# Patient Record
Sex: Female | Born: 1935 | Race: White | Hispanic: No | Smoking: Former smoker
Health system: Southern US, Community
[De-identification: ages and names within clinical notes are randomized; demographics above are authoritative.]

## PROBLEM LIST (undated history)

## (undated) DIAGNOSIS — G309 Alzheimer's disease, unspecified: Secondary | ICD-10-CM

## (undated) DIAGNOSIS — F17200 Nicotine dependence, unspecified, uncomplicated: Secondary | ICD-10-CM

## (undated) DIAGNOSIS — I252 Old myocardial infarction: Secondary | ICD-10-CM

## (undated) DIAGNOSIS — N8111 Cystocele, midline: Secondary | ICD-10-CM

## (undated) DIAGNOSIS — M549 Dorsalgia, unspecified: Secondary | ICD-10-CM

## (undated) DIAGNOSIS — R413 Other amnesia: Secondary | ICD-10-CM

## (undated) DIAGNOSIS — H269 Unspecified cataract: Secondary | ICD-10-CM

## (undated) DIAGNOSIS — N183 Chronic kidney disease, stage 3 (moderate): Secondary | ICD-10-CM

## (undated) DIAGNOSIS — F0280 Dementia in other diseases classified elsewhere without behavioral disturbance: Secondary | ICD-10-CM

## (undated) DIAGNOSIS — R32 Unspecified urinary incontinence: Secondary | ICD-10-CM

## (undated) DIAGNOSIS — E790 Hyperuricemia without signs of inflammatory arthritis and tophaceous disease: Secondary | ICD-10-CM

## (undated) DIAGNOSIS — G4700 Insomnia, unspecified: Secondary | ICD-10-CM

## (undated) DIAGNOSIS — I2589 Other forms of chronic ischemic heart disease: Secondary | ICD-10-CM

## (undated) DIAGNOSIS — E538 Deficiency of other specified B group vitamins: Secondary | ICD-10-CM

## (undated) DIAGNOSIS — M810 Age-related osteoporosis without current pathological fracture: Secondary | ICD-10-CM

## (undated) DIAGNOSIS — E786 Lipoprotein deficiency: Secondary | ICD-10-CM

## (undated) DIAGNOSIS — I10 Essential (primary) hypertension: Secondary | ICD-10-CM

## (undated) DIAGNOSIS — R269 Unspecified abnormalities of gait and mobility: Secondary | ICD-10-CM

## (undated) DIAGNOSIS — E782 Mixed hyperlipidemia: Secondary | ICD-10-CM

## (undated) DIAGNOSIS — F068 Other specified mental disorders due to known physiological condition: Secondary | ICD-10-CM

## (undated) DIAGNOSIS — I2510 Atherosclerotic heart disease of native coronary artery without angina pectoris: Secondary | ICD-10-CM

## (undated) DIAGNOSIS — R159 Full incontinence of feces: Secondary | ICD-10-CM

## (undated) DIAGNOSIS — Z8673 Personal history of transient ischemic attack (TIA), and cerebral infarction without residual deficits: Secondary | ICD-10-CM

## (undated) HISTORY — DX: Cystocele, midline: N81.11

## (undated) HISTORY — DX: Lipoprotein deficiency: E78.6

## (undated) HISTORY — DX: Hyperuricemia without signs of inflammatory arthritis and tophaceous disease: E79.0

## (undated) HISTORY — DX: Atherosclerotic heart disease of native coronary artery without angina pectoris: I25.10

## (undated) HISTORY — DX: Insomnia, unspecified: G47.00

## (undated) HISTORY — DX: Deficiency of other specified B group vitamins: E53.8

## (undated) HISTORY — DX: Other forms of chronic ischemic heart disease: I25.89

## (undated) HISTORY — DX: Nicotine dependence, unspecified, uncomplicated: F17.200

## (undated) HISTORY — PX: CATARACT EXTRACTION: SUR2

## (undated) HISTORY — DX: Dorsalgia, unspecified: M54.9

## (undated) HISTORY — DX: Full incontinence of feces: R15.9

## (undated) HISTORY — DX: Unspecified urinary incontinence: R32

## (undated) HISTORY — DX: Old myocardial infarction: I25.2

## (undated) HISTORY — DX: Other amnesia: R41.3

## (undated) HISTORY — DX: Essential (primary) hypertension: I10

## (undated) HISTORY — DX: Age-related osteoporosis without current pathological fracture: M81.0

## (undated) HISTORY — DX: Other specified mental disorders due to known physiological condition: F06.8

## (undated) HISTORY — DX: Mixed hyperlipidemia: E78.2

## (undated) HISTORY — DX: Personal history of transient ischemic attack (TIA), and cerebral infarction without residual deficits: Z86.73

## (undated) HISTORY — DX: Unspecified cataract: H26.9

## (undated) HISTORY — DX: Chronic kidney disease, stage 3 (moderate): N18.3

## (undated) HISTORY — DX: Unspecified abnormalities of gait and mobility: R26.9

## (undated) HISTORY — PX: ABDOMINAL HYSTERECTOMY: SHX81

---

## 2003-06-11 DIAGNOSIS — Z8673 Personal history of transient ischemic attack (TIA), and cerebral infarction without residual deficits: Secondary | ICD-10-CM

## 2003-06-11 HISTORY — DX: Personal history of transient ischemic attack (TIA), and cerebral infarction without residual deficits: Z86.73

## 2007-08-09 DIAGNOSIS — F068 Other specified mental disorders due to known physiological condition: Secondary | ICD-10-CM

## 2007-08-09 HISTORY — DX: Other specified mental disorders due to known physiological condition: F06.8

## 2007-09-01 DIAGNOSIS — F17200 Nicotine dependence, unspecified, uncomplicated: Secondary | ICD-10-CM

## 2007-09-01 HISTORY — DX: Nicotine dependence, unspecified, uncomplicated: F17.200

## 2007-12-01 DIAGNOSIS — N183 Chronic kidney disease, stage 3 unspecified: Secondary | ICD-10-CM

## 2007-12-01 HISTORY — DX: Chronic kidney disease, stage 3 unspecified: N18.30

## 2010-12-19 ENCOUNTER — Ambulatory Visit: Payer: Self-pay | Admitting: Ophthalmology

## 2010-12-31 ENCOUNTER — Ambulatory Visit: Payer: Self-pay | Admitting: Ophthalmology

## 2011-02-05 ENCOUNTER — Ambulatory Visit: Payer: Self-pay | Admitting: Ophthalmology

## 2011-06-21 ENCOUNTER — Ambulatory Visit: Payer: Self-pay | Admitting: Family Medicine

## 2012-07-03 DIAGNOSIS — M810 Age-related osteoporosis without current pathological fracture: Secondary | ICD-10-CM

## 2012-07-03 HISTORY — DX: Age-related osteoporosis without current pathological fracture: M81.0

## 2012-11-04 ENCOUNTER — Ambulatory Visit: Payer: Medicare Other | Admitting: Neurology

## 2012-11-19 ENCOUNTER — Encounter: Payer: Self-pay | Admitting: Neurology

## 2012-11-19 ENCOUNTER — Ambulatory Visit (INDEPENDENT_AMBULATORY_CARE_PROVIDER_SITE_OTHER): Payer: Medicare Other | Admitting: Neurology

## 2012-11-19 VITALS — BP 112/62 | HR 88 | Wt 138.0 lb

## 2012-11-19 DIAGNOSIS — R413 Other amnesia: Secondary | ICD-10-CM

## 2012-11-19 DIAGNOSIS — D518 Other vitamin B12 deficiency anemias: Secondary | ICD-10-CM

## 2012-11-19 DIAGNOSIS — R6889 Other general symptoms and signs: Secondary | ICD-10-CM

## 2012-11-19 DIAGNOSIS — R269 Unspecified abnormalities of gait and mobility: Secondary | ICD-10-CM

## 2012-11-19 HISTORY — DX: Unspecified abnormalities of gait and mobility: R26.9

## 2012-11-19 HISTORY — DX: Other amnesia: R41.3

## 2012-11-19 NOTE — Progress Notes (Signed)
Reason for visit: Memory disturbance  Kathleen Graham is a 79 y.o. female  History of present illness:  Kathleen Graham is a 79 year old white female with a history of a progressive memory disturbance that has been present for about 5 years. The patient has progressed relatively slowly, and she currently is living with her daughter-in-law. The patient in the past has had problems with hallucinations, but this issue has not been as significant lately. The patient is able to perform some activities of daily living on her own such as bathing, dressing, and feeding himself. The patient is unable to handle her financial affairs, and she is no longer able to cook. The patient does not operate a motor vehicle. The patient has a mild gait disorder, and she requires some assistance with ambulation. The patient is on Aricept and Namenda, and the family indicates that a recent head scan has not been done. The patient is sent to this office for an evaluation. The family has contacted an attorney concerning legal guardianship. This issue is one of the reasons for the referral to our office.  Past Medical History  Diagnosis Date  . Backache, unspecified   . Full incontinence of feces   . Mixed hyperlipidemia   . History of acute myocardial infarction   . Cystocele, midline   . Coronary atherosclerosis of unspecified type of vessel, native or graft   . Osteoporosis, unspecified 07/03/12    Hip and foremar by DEXA  . 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 Hyperuricemia     Due to HCTZ  . Unspecified urinary incontinence   . Lipoprotein deficiencies   . Unspecified cataract   . History of TIA (transient ischemic attack) 2005  . Tobacco use disorder 09/01/2007  . Other B-complex deficiencies   . Other persistent mental disorders due to conditions classified elsewhere 08-2007  . Essential hypertension, benign   . Insomnia, unspecified   . Chronic kidney disease, stage III (moderate)  12/01/2007  . Memory deficits 11/19/2012  . Abnormality of gait 11/19/2012    Past Surgical History  Procedure Laterality Date  . Cataract extraction Bilateral     Family History  Problem Relation Age of Onset  . Heart attack Son     Social history:  reports that she quit smoking about 17 months ago. She does not have any smokeless tobacco history on file. She reports that she does not drink alcohol or use illicit drugs.  Medications:  No current outpatient prescriptions on file prior to visit.   No current facility-administered medications on file prior to visit.    Allergies:  Allergies  Allergen Reactions  . Hydrochlorothiazide     Hyperurlcemia  . Tramadol     Leg weakness    ROS:  Out of a complete 14 system review of symptoms, the patient complains only of the following symptoms, and all other reviewed systems are negative.  Shortness of breath, cough, wheezing, snoring Feeling cold Memory loss, confusion Slurred speech, dizziness Depression, anxiety, decreased energy, change in appetite Sleepiness  Blood pressure 112/62, pulse 88, weight 138 lb (62.596 kg).  Physical Exam  General: The patient is alert and cooperative at the time of the examination.  Head: Pupils are equal, round, and reactive to light. Discs are flat bilaterally.  Neck: The neck is supple, no carotid bruits are noted.  Respiratory: The respiratory examination is clear.  Cardiovascular: The cardiovascular examination reveals a regular rate and rhythm, no obvious murmurs or rubs  are noted.  Skin: Extremities are with 1+ edema in the ankles bilaterally.  Neurologic Exam  Mental status: Mini-Mental status examination done today shows a total score of 12/30. The patient is able to name 6 animals in 60 seconds.  Cranial nerves: Facial symmetry is present. There is good sensation of the face to pinprick and soft touch bilaterally. The strength of the facial muscles and the muscles to head  turning and shoulder shrug are normal bilaterally. Speech is well enunciated, no aphasia or dysarthria is noted. Extraocular movements are full. Visual fields are full.  Motor: The motor testing reveals 5 over 5 strength of all 4 extremities. Good symmetric motor tone is noted throughout.  Sensory: Sensory testing is intact to pinprick, soft touch, vibration sensation, and position sense on all 4 extremities, with the exception that position sense is decreased on the right greater than left foot. No evidence of extinction is noted.  Coordination: Cerebellar testing reveals good finger-nose-finger bilaterally, but the patient has significant apraxia with the use of the lower extremities, and heel-to-shin could not be performed..  Gait and station: Gait is slightly wide-based, unsteady. Tandem gait was not attempted. Romberg is negative. No drift is seen.  Reflexes: Deep tendon reflexes are symmetric, but are depressed bilaterally. Toes are downgoing bilaterally.   Assessment/Plan:  One. Progressive memory disturbance  2. Gait disturbance  The patient will be set up for some blood work today, and a CT scan of the brain. The patient is to remain on Aricept and Namenda. The patient likely has a moderate range of dementia, and she likely is not competent to make legal, financial, and medical decisions for herself. The patient will likely need a legal guardian for this purpose. The patient will followup through this office if needed.  Marlan Palau MD 11/19/2012 4:34 PM  [facility] [address] [place], [place] [address]  Phone [phone] Fax [phone]

## 2012-11-20 ENCOUNTER — Telehealth: Payer: Self-pay | Admitting: *Deleted

## 2012-11-20 LAB — TSH: TSH: 1.42 u[IU]/mL (ref 0.450–4.500)

## 2012-11-20 NOTE — Telephone Encounter (Signed)
LMVM home # re: lab results unremarkable.  She is to call back if questions.

## 2012-11-20 NOTE — Telephone Encounter (Signed)
Message copied by Hermenia Fiscal on Fri Nov 20, 2012  9:14 AM ------      Message from: Stephanie Acre      Created: Fri Nov 20, 2012  7:51 AM       Please call the patient. The blood work results are unremarkable. Thank you.            ----- Message -----         From: [facility] Lab Results In Interface         Sent: 11/20/2012   5:50 AM           To: York Spaniel, MD                   ------

## 2012-11-24 ENCOUNTER — Ambulatory Visit
Admission: RE | Admit: 2012-11-24 | Discharge: 2012-11-24 | Disposition: A | Discharge status: Home / Self Care | Payer: Medicare Other | Source: Ambulatory Visit | Attending: Neurology | Admitting: Neurology

## 2012-11-24 DIAGNOSIS — R413 Other amnesia: Secondary | ICD-10-CM

## 2012-11-25 ENCOUNTER — Telehealth: Payer: Self-pay | Admitting: Neurology

## 2012-11-25 NOTE — Telephone Encounter (Signed)
I called the patient, and I talked with the caretaker. The CT scan of the head shows some cortical atrophy and some small vessel disease, both progressed from 2007. The patient is on low-dose aspirin. The blood work done was unremarkable. The patient is already on medications for memory, nothing to add at this point.

## 2013-01-13 ENCOUNTER — Other Ambulatory Visit: Payer: Self-pay

## 2013-04-15 ENCOUNTER — Other Ambulatory Visit: Payer: Self-pay

## 2013-07-07 ENCOUNTER — Encounter (HOSPITAL_COMMUNITY): Payer: Self-pay | Admitting: Emergency Medicine

## 2013-07-07 ENCOUNTER — Emergency Department (HOSPITAL_COMMUNITY): Payer: Medicare Other

## 2013-07-07 ENCOUNTER — Inpatient Hospital Stay (HOSPITAL_COMMUNITY): Payer: Medicare Other

## 2013-07-07 ENCOUNTER — Observation Stay (HOSPITAL_COMMUNITY)
Admission: EM | Admit: 2013-07-07 | Discharge: 2013-07-10 | Disposition: A | Discharge status: Home / Self Care | Payer: Medicare Other | Attending: Internal Medicine | Admitting: Internal Medicine

## 2013-07-07 DIAGNOSIS — Z792 Long term (current) use of antibiotics: Secondary | ICD-10-CM | POA: Insufficient documentation

## 2013-07-07 DIAGNOSIS — J4489 Other specified chronic obstructive pulmonary disease: Secondary | ICD-10-CM | POA: Insufficient documentation

## 2013-07-07 DIAGNOSIS — Z7982 Long term (current) use of aspirin: Secondary | ICD-10-CM | POA: Insufficient documentation

## 2013-07-07 DIAGNOSIS — I5020 Unspecified systolic (congestive) heart failure: Secondary | ICD-10-CM | POA: Insufficient documentation

## 2013-07-07 DIAGNOSIS — R269 Unspecified abnormalities of gait and mobility: Secondary | ICD-10-CM | POA: Insufficient documentation

## 2013-07-07 DIAGNOSIS — I951 Orthostatic hypotension: Principal | ICD-10-CM

## 2013-07-07 DIAGNOSIS — M549 Dorsalgia, unspecified: Secondary | ICD-10-CM | POA: Insufficient documentation

## 2013-07-07 DIAGNOSIS — R9431 Abnormal electrocardiogram [ECG] [EKG]: Secondary | ICD-10-CM | POA: Insufficient documentation

## 2013-07-07 DIAGNOSIS — N183 Chronic kidney disease, stage 3 unspecified: Secondary | ICD-10-CM | POA: Insufficient documentation

## 2013-07-07 DIAGNOSIS — I4949 Other premature depolarization: Secondary | ICD-10-CM | POA: Insufficient documentation

## 2013-07-07 DIAGNOSIS — F0390 Unspecified dementia without behavioral disturbance: Secondary | ICD-10-CM

## 2013-07-07 DIAGNOSIS — I129 Hypertensive chronic kidney disease with stage 1 through stage 4 chronic kidney disease, or unspecified chronic kidney disease: Secondary | ICD-10-CM | POA: Insufficient documentation

## 2013-07-07 DIAGNOSIS — I509 Heart failure, unspecified: Secondary | ICD-10-CM | POA: Insufficient documentation

## 2013-07-07 DIAGNOSIS — I2510 Atherosclerotic heart disease of native coronary artery without angina pectoris: Secondary | ICD-10-CM | POA: Insufficient documentation

## 2013-07-07 DIAGNOSIS — G8929 Other chronic pain: Secondary | ICD-10-CM | POA: Insufficient documentation

## 2013-07-07 DIAGNOSIS — Z8673 Personal history of transient ischemic attack (TIA), and cerebral infarction without residual deficits: Secondary | ICD-10-CM | POA: Insufficient documentation

## 2013-07-07 DIAGNOSIS — J449 Chronic obstructive pulmonary disease, unspecified: Secondary | ICD-10-CM | POA: Diagnosis present

## 2013-07-07 DIAGNOSIS — E785 Hyperlipidemia, unspecified: Secondary | ICD-10-CM | POA: Diagnosis present

## 2013-07-07 DIAGNOSIS — R413 Other amnesia: Secondary | ICD-10-CM | POA: Insufficient documentation

## 2013-07-07 DIAGNOSIS — M810 Age-related osteoporosis without current pathological fracture: Secondary | ICD-10-CM | POA: Insufficient documentation

## 2013-07-07 DIAGNOSIS — R791 Abnormal coagulation profile: Secondary | ICD-10-CM | POA: Insufficient documentation

## 2013-07-07 DIAGNOSIS — R55 Syncope and collapse: Secondary | ICD-10-CM

## 2013-07-07 DIAGNOSIS — I672 Cerebral atherosclerosis: Secondary | ICD-10-CM | POA: Insufficient documentation

## 2013-07-07 DIAGNOSIS — F0150 Vascular dementia without behavioral disturbance: Secondary | ICD-10-CM | POA: Insufficient documentation

## 2013-07-07 DIAGNOSIS — I447 Left bundle-branch block, unspecified: Secondary | ICD-10-CM

## 2013-07-07 DIAGNOSIS — I252 Old myocardial infarction: Secondary | ICD-10-CM

## 2013-07-07 DIAGNOSIS — Z87891 Personal history of nicotine dependence: Secondary | ICD-10-CM | POA: Insufficient documentation

## 2013-07-07 DIAGNOSIS — E782 Mixed hyperlipidemia: Secondary | ICD-10-CM | POA: Insufficient documentation

## 2013-07-07 LAB — COMPREHENSIVE METABOLIC PANEL
ALBUMIN: 3.2 g/dL — AB (ref 3.5–5.2)
ALT: 15 U/L (ref 0–35)
AST: 22 U/L (ref 0–37)
Alkaline Phosphatase: 137 U/L — ABNORMAL HIGH (ref 39–117)
BUN: 11 mg/dL (ref 6–23)
CO2: 26 mEq/L (ref 19–32)
CREATININE: 1.45 mg/dL — AB (ref 0.50–1.10)
Calcium: 9 mg/dL (ref 8.4–10.5)
Chloride: 101 mEq/L (ref 96–112)
GFR calc non Af Amer: 33 mL/min — ABNORMAL LOW (ref >90)
GFR, EST AFRICAN AMERICAN: 38 mL/min — AB (ref >90)
GLUCOSE: 104 mg/dL — AB (ref 70–99)
Potassium: 4.3 mEq/L (ref 3.7–5.3)
Sodium: 139 mEq/L (ref 137–147)
TOTAL PROTEIN: 7.4 g/dL (ref 6.0–8.3)
Total Bilirubin: 0.4 mg/dL (ref 0.3–1.2)

## 2013-07-07 LAB — POCT I-STAT TROPONIN I: Troponin i, poc: 0.01 ng/mL (ref 0.00–0.08)

## 2013-07-07 LAB — URINALYSIS, ROUTINE W REFLEX MICROSCOPIC
Bilirubin Urine: NEGATIVE
Glucose, UA: NEGATIVE mg/dL
Hgb urine dipstick: NEGATIVE
Ketones, ur: NEGATIVE mg/dL
Leukocytes, UA: NEGATIVE
NITRITE: NEGATIVE
PROTEIN: NEGATIVE mg/dL
SPECIFIC GRAVITY, URINE: 1.016 (ref 1.005–1.030)
Urobilinogen, UA: 1 mg/dL (ref 0.0–1.0)
pH: 7 (ref 5.0–8.0)

## 2013-07-07 LAB — CBC WITH DIFFERENTIAL/PLATELET
BASOS PCT: 0 % (ref 0–1)
Basophils Absolute: 0 10*3/uL (ref 0.0–0.1)
EOS ABS: 0.4 10*3/uL (ref 0.0–0.7)
Eosinophils Relative: 3 % (ref 0–5)
HEMATOCRIT: 36.5 % (ref 36.0–46.0)
HEMOGLOBIN: 12.4 g/dL (ref 12.0–15.0)
LYMPHS ABS: 2.5 10*3/uL (ref 0.7–4.0)
Lymphocytes Relative: 21 % (ref 12–46)
MCH: 30.8 pg (ref 26.0–34.0)
MCHC: 34 g/dL (ref 30.0–36.0)
MCV: 90.6 fL (ref 78.0–100.0)
MONO ABS: 0.8 10*3/uL (ref 0.1–1.0)
MONOS PCT: 7 % (ref 3–12)
Neutro Abs: 8.1 10*3/uL — ABNORMAL HIGH (ref 1.7–7.7)
Neutrophils Relative %: 69 % (ref 43–77)
Platelets: 153 10*3/uL (ref 150–400)
RBC: 4.03 MIL/uL (ref 3.87–5.11)
RDW: 13.9 % (ref 11.5–15.5)
WBC: 11.8 10*3/uL — ABNORMAL HIGH (ref 4.0–10.5)

## 2013-07-07 LAB — D-DIMER, QUANTITATIVE: D-Dimer, Quant: 4.44 ug/mL-FEU — ABNORMAL HIGH (ref 0.00–0.48)

## 2013-07-07 LAB — LACTIC ACID, PLASMA: Lactic Acid, Venous: 1.9 mmol/L (ref 0.5–2.2)

## 2013-07-07 LAB — TROPONIN I

## 2013-07-07 MED ORDER — SODIUM CHLORIDE 0.9 % IV SOLN
INTRAVENOUS | Status: DC
  Administered 2013-07-08: via INTRAVENOUS

## 2013-07-07 MED ORDER — ONDANSETRON HCL 4 MG/2ML IJ SOLN: 4.0000 mg | Freq: Four times a day (QID) | INTRAMUSCULAR | Status: DC | PRN

## 2013-07-07 MED ORDER — ONDANSETRON HCL 4 MG PO TABS: 4.0000 mg | ORAL_TABLET | Freq: Four times a day (QID) | ORAL | Status: DC | PRN

## 2013-07-07 MED ORDER — TIOTROPIUM BROMIDE MONOHYDRATE 18 MCG IN CAPS
18.0000 ug | ORAL_CAPSULE | Freq: Every day | RESPIRATORY_TRACT | Status: DC
  Administered 2013-07-08 – 2013-07-10 (×2): 18 ug via RESPIRATORY_TRACT
  Filled 2013-07-07: qty 5

## 2013-07-07 MED ORDER — ACETAMINOPHEN 650 MG RE SUPP: 650.0000 mg | Freq: Four times a day (QID) | RECTAL | Status: DC | PRN

## 2013-07-07 MED ORDER — SODIUM CHLORIDE 0.9 % IV BOLUS (SEPSIS)
500.0000 mL | Freq: Once | INTRAVENOUS | Status: AC
  Administered 2013-07-07: 500 mL via INTRAVENOUS

## 2013-07-07 MED ORDER — ACETAMINOPHEN 325 MG PO TABS
650.0000 mg | ORAL_TABLET | Freq: Four times a day (QID) | ORAL | Status: DC | PRN
  Administered 2013-07-10: 650 mg via ORAL
  Filled 2013-07-07: qty 2

## 2013-07-07 MED ORDER — ASPIRIN 81 MG PO CHEW
81.0000 mg | CHEWABLE_TABLET | Freq: Once | ORAL | Status: AC
  Administered 2013-07-08: 81 mg via ORAL
  Filled 2013-07-07: qty 1

## 2013-07-07 MED ORDER — ALBUTEROL SULFATE (2.5 MG/3ML) 0.083% IN NEBU: 2.5000 mg | INHALATION_SOLUTION | RESPIRATORY_TRACT | Status: DC | PRN

## 2013-07-07 MED ORDER — HEPARIN SODIUM (PORCINE) 5000 UNIT/ML IJ SOLN
5000.0000 [IU] | Freq: Three times a day (TID) | INTRAMUSCULAR | Status: DC
  Administered 2013-07-08 – 2013-07-10 (×6): 5000 [IU] via SUBCUTANEOUS
  Filled 2013-07-07 (×10): qty 1

## 2013-07-07 MED ORDER — SODIUM CHLORIDE 0.9 % IJ SOLN
3.0000 mL | Freq: Two times a day (BID) | INTRAMUSCULAR | Status: DC
  Administered 2013-07-08: 3 mL via INTRAVENOUS

## 2013-07-07 NOTE — ED Notes (Signed)
Assumed care of the patient at this time. Family at the bedside.

## 2013-07-07 NOTE — ED Notes (Signed)
Per EMS pt from home with c/o syncopal episode while taking shower. Pt's daughter in law states she was giving her a shower, she passed out, turned grey, stopped breathing. They gave her mouth to mouth, she began to vomit. Color began to return afterwards. EKG shows ST elevation in V1-V4. No chest pain. Pt c/o upper back pain above shoulder blades. IV 18G RAC. CBG 125.

## 2013-07-07 NOTE — ED Notes (Signed)
Pt c/o pain to upper back/lower neck. Pt states she had syncopal episode. Denies chest pain. Denies any recent illness.

## 2013-07-07 NOTE — ED Provider Notes (Signed)
CSN: [id number]     Arrival date & time 07/07/13  1333 History   None    Chief Complaint  Graham presents with  . Abnormal ECG  . Back Pain    HPI: Kathleen Graham is a 80 yo F with history of HLD, CAD, chronic UTI, HTN and dementia, who presents for evaluation of syncopal episode. Kathleen Graham was in Kathleen Graham normal state of health until earlier this afternoon when Kathleen Graham had a bowel movement. Kathleen Graham caregiver put Kathleen Graham into Kathleen shower, Kathleen Graham was sitting down and was uncooperative with Kathleen Graham caregiver, which is not typical for Kathleen Graham. Kathleen Graham then had a syncopal episode lasting for one minute. Kathleen Graham apparently stopped breathing and turned blue, Kathleen Graham was given a few breaths of mouth to mouth resuscitation and recovered. Once Kathleen Graham was awake Kathleen Graham had one episode of emesis. EMS was called, on their arrival Kathleen Graham initial blood pressure was 70 systolic but otherwise was at Kathleen Graham baseline. Kathleen Graham also complained of pain to Kathleen Graham mid thoracic spine just prior to syncopal episode. Kathleen Graham denies any pain on my assessment. Kathleen Graham has a history of chronic low back pain but usually does not complain of thoracic pain. Kathleen Graham currently is on prophylactic Macrobid for chronic UTI.    Past Medical History  Diagnosis Date  . Backache, unspecified   . Full incontinence of feces   . Mixed hyperlipidemia   . History of acute myocardial infarction   . Cystocele, midline   . Coronary atherosclerosis of unspecified type of vessel, native or graft   . Osteoporosis, unspecified 07/03/12    Hip and foremar by DEXA  . 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 Hyperuricemia     Due to HCTZ  . Unspecified urinary incontinence   . Lipoprotein deficiencies   . Unspecified cataract   . History of TIA (transient ischemic attack) 2005  . Tobacco use disorder 09/01/2007  . Other B-complex deficiencies   . Other persistent mental disorders due to conditions classified elsewhere 08-2007  . Essential hypertension, benign   . Insomnia, unspecified   . Chronic  kidney disease, stage III (moderate) 12/01/2007  . Memory deficits 11/19/2012  . Abnormality of gait 11/19/2012   Past Surgical History  Procedure Laterality Date  . Cataract extraction Bilateral    Family History  Problem Relation Age of Onset  . Heart attack Son    History  Substance Use Topics  . Smoking status: Former Smoker    Quit date: 06/11/2011  . Smokeless tobacco: Not on file  . Alcohol Use: No   OB History   Grav Para Term Preterm Abortions TAB SAB Ect Mult Living                  Review of Systems  Constitutional: Negative for fever, chills, appetite change and fatigue.  Eyes: Negative for photophobia and visual disturbance.  Respiratory: Negative for cough and shortness of breath.   Cardiovascular: Negative for chest pain and leg swelling.  Gastrointestinal: Positive for nausea and vomiting. Negative for abdominal pain, diarrhea and constipation.  Genitourinary: Negative for dysuria, frequency and decreased urine volume.  Musculoskeletal: Positive for back pain. Negative for arthralgias, gait problem and myalgias.  Skin: Negative for color change and wound.  Neurological: Positive for syncope. Negative for dizziness, light-headedness and headaches.  Psychiatric/Behavioral: Negative for confusion and agitation.  All other systems reviewed and are negative.     Allergies  Hydrochlorothiazide and Tramadol  Home Medications   Current Outpatient Rx  Name  Route  Sig  Dispense  Refill  . albuterol (PROVENTIL) (2.5 MG/3ML) 0.083% nebulizer solution   Nebulization   Take 2.5 mg by nebulization every 6 (six) hours as needed for wheezing.         Marland Kitchen. alendronate (FOSAMAX) 70 MG tablet   Oral   Take 70 mg by mouth daily.         Marland Kitchen. aspirin 81 MG tablet   Oral   Take 81 mg by mouth daily.         Marland Kitchen. atorvastatin (LIPITOR) 40 MG tablet   Oral   Take 40 mg by mouth daily.         . benzonatate (TESSALON) 200 MG capsule   Oral   Take 200 mg by mouth  daily.         . Calcium Carbonate-Vitamin D 600-400 MG-UNIT per chew tablet   Oral   Chew 1 tablet by mouth daily.         . ciprofloxacin (CIPRO) 250 MG tablet   Oral   Take 250 mg by mouth daily.         . citalopram (CELEXA) 20 MG tablet   Oral   Take 20 mg by mouth daily.         . diazepam (VALIUM) 5 MG tablet   Oral   Take 5 mg by mouth every 6 (six) hours as needed for anxiety (Take one tablet before doctor's appt.).         Marland Kitchen. donepezil (ARICEPT) 10 MG tablet   Oral   Take 10 mg by mouth daily.         . Folic Acid-Vit B6-Vit B12 (FABB) 2.2-25-1 MG TABS   Oral   Take 1 tablet by mouth daily.         Marland Kitchen. NAMENDA 10 MG tablet   Oral   Take 10 mg by mouth daily.         Marland Kitchen. SPIRIVA HANDIHALER 18 MCG inhalation capsule   Inhalation   Place 18 mcg into inhaler and inhale as needed.         . vitamin B-12 (CYANOCOBALAMIN) 1000 MCG tablet   Oral   Take 1,000 mcg by mouth daily.          BP 102/47  Pulse 74  Temp(Src) 98.2 F (36.8 C) (Oral)  Resp 18  SpO2 99% Physical Exam  Nursing note and vitals reviewed. Constitutional: No distress.  Thin, elderly female, laying in bed, appears comfortable. No complaints.   HENT:  Head: Normocephalic and atraumatic.  Mouth/Throat: Oropharynx is clear and moist.  Eyes: Conjunctivae and EOM are normal. Pupils are equal, round, and reactive to light.  Neck: Normal range of motion. Neck supple.  Cardiovascular: Normal rate, regular rhythm and intact distal pulses.   Murmur heard.  Systolic murmur is present with a grade of 2/6  Pulmonary/Chest: Effort normal and breath sounds normal. No respiratory distress.  Abdominal: Soft. Bowel sounds are normal. There is no tenderness. There is no rebound and no guarding.  Musculoskeletal: Normal range of motion. Kathleen Graham exhibits no edema and no tenderness.  Neurological: Kathleen Graham is alert. Kathleen Graham has normal strength and normal reflexes. Kathleen Graham is disoriented (alert to person and  place which is baseline for Kathleen Graham). No cranial nerve deficit or sensory deficit. Coordination normal. GCS eye subscore is 4. GCS verbal subscore is 4. GCS motor subscore is 6.  Skin: Skin is warm and dry. No rash noted.  Psychiatric:  Kathleen Graham has a normal mood and affect. Kathleen Graham behavior is normal.    ED Course  Procedures (including critical care time) Labs Review Labs Reviewed  CBC WITH DIFFERENTIAL - Abnormal; Notable for Kathleen following:    WBC 11.8 (*)    Neutro Abs 8.1 (*)    All other components within normal limits  COMPREHENSIVE METABOLIC PANEL - Abnormal; Notable for Kathleen following:    Glucose, Bld 104 (*)    Creatinine, Ser 1.45 (*)    Albumin 3.2 (*)    Alkaline Phosphatase 137 (*)    GFR calc non Af Amer 33 (*)    GFR calc Af Amer 38 (*)    All other components within normal limits  POCT I-STAT TROPONIN I   Imaging Review Dg Chest Portable 1 View  07/07/2013   CLINICAL DATA:  Abnormal EKG.  Back pain  EXAM: PORTABLE CHEST - 1 VIEW  COMPARISON:  12/08/2012  FINDINGS: Chronic lung disease with diffuse interstitial fibrosis. Improved lung volume compared with Kathleen prior study. No focal infiltrate or effusion is identified.  IMPRESSION: Chronic lung disease with interstitial fibrosis. No superimposed acute abnormality.   Electronically Signed   By: Marlan Palau M.D.   On: 07/07/2013 14:04    EKG Interpretation    Date/Time:  Wednesday July 07 2013 14:02:51 EST Ventricular Rate:  57 PR Interval:  162 QRS Duration: 142 QT Interval:  486 QTC Calculation: 473 R Axis:   -131 Text Interpretation:  Suspect arm lead reversal, interpretation assumes no reversal Sinus bradycardia Non-specific intra-ventricular conduction block Lateral infarct , age undetermined Abnormal ECG No significant change since last tracing Confirmed by POLLINA  MD, CHRISTOPHER ([id number]) on 07/07/2013 3:28:58 PM            MDM   80 yo F with history of HLD, CAD, chronic UTI, HTN and dementia, who  presents for evaluation of syncopal episode. Short period of apnea with color change per family. Currently at Kathleen Graham baseline. Kathleen Graham is hypotensive. I suspect Kathleen Graham is volume depleted which lead to Kathleen Graham syncopal episode. WBC elevated to 11.8, normal CXR, doubt PNA. Lytes normal. Does have a history of CKD, Cr is 1.45, unknown baseline but likely close. Troponin negative, no acute ischemic changes on ECG, doubt ACS. Doubt PE as Kathleen Graham has no SOB or chest pain. No evidence of aortic dissection on CXR.  Will add UA and lactic acid to complete evaluation. Bolus 500 cc and reassess BP. Will require admission given syncopal episode, advanced age and hypotension.   Lactic acid 1.9, troponin X 2 negative. No signs of infection as CXR without consolidation, no fever. UA without infection. Lytes normal. BP improved to 130's systolic after 500 cc bolus. Graham was admitted to teaching service. I spoke to Kathleen Graham and Kathleen Graham daughter about work up and need for admission, they were in agreement with plan. Kathleen Graham remained HD stable in Kathleen ED.   Reviewed imaging, labs, ECG and previous medical records, utilized in MDM  Discussed case with Dr. Blinda Leatherwood.   Clinical Impression 1. Syncope 2. Leukocytosis   Margie Billet, MD 07/08/13 [phone]

## 2013-07-07 NOTE — ED Notes (Signed)
Flow manager called in regards to delay with inpatient bed. Should have an inpatient bed within the half hour

## 2013-07-07 NOTE — ED Notes (Signed)
Family at bedside states pt is incontinent. Family and patient agree to in and out cath

## 2013-07-07 NOTE — ED Notes (Signed)
admitting MD's at bedside

## 2013-07-07 NOTE — H&P (Signed)
Date: 07/07/2013               Patient Name:  Kathleen Graham MRN: [id number]  DOB: Jan 27, 1932 Age / Sex: 80 y.o., female   PCP: Leonides Sake, MD         Medical Service: Internal Medicine Teaching Service         Attending Physician: Dr. Axel Filler, MD    First Contact: Dr. Rebecca Eaton, MD Pager: [phone]  Second Contact: Dr. Jerene Pitch, [facility] Pager: [phone]       After Hours (After 5p/  First Contact Pager: [phone]  weekends / holidays): Second Contact Pager: [phone]   Chief Complaint: Syncope  History of Present Illness: Kathleen Graham is a 80 y.o. woman w/ pmhx of TIA, MI 2 years ago,n severe vascular dementia, HTN, HLD who presents with a cc of syncope. The patient was in her normal state of health. At baseline, she is a+o to person. This afternoon she had an episode of fecal incontinence, which is her baseline. However, this episode was loose while she normally has solid stools. According to the family, this was the first episode of diarrhea. Of note, the patient is on chronic ABX therapy (ciprofloxacin) for chronic UTI. The patient was transferred to the shower the clean her up. She was placed on the commode by  patients primary caregiver and she had a BM. She was then transferred to a shelf in the shower. At this time the patient called out in pain and told the daughter in law that she had pain between the shoulder blades. Apparently this pain is similar to her chronic back pains. While the patients daughter in law started to shower the patient, the patient lost consciousness. The daughter-in-law grabbed the patient and gently lowered her onto the floor. The patient was found to have no pulse and was not breathing. The patient was transferred from the shower to the bed. The patient received mouth to mouth breaths from a family member. The patient appears cyanotic and was unresponsive for 2-3 minutes. The patient regained consciousness and was breathing. She vomiting stomach  contents x 2. EMS arrive on the scene and the patient was hypotensive at ~70/40's. No EMS EKG or rhythm strips were available for review from EMS however, EMS apparently reported ST elevation on the rhythm strip. The patient was back to baseline mental status immediately after waking according to the family members. Upon arrival, BP was 102/47 and was subsequently bolused with 500 cc NS. BP increased to 131/48.  Of note, the daughter in law states that a similar event happened a few weeks ago. During that even the patient passed out briefly, but had no loss of pulse   Meds: No current facility-administered medications for this encounter.   Current Outpatient Prescriptions  Medication Sig Dispense Refill  . albuterol (PROVENTIL) (2.5 MG/3ML) 0.083% nebulizer solution Take 2.5 mg by nebulization every 6 (six) hours as needed for wheezing.      Marland Kitchen alendronate (FOSAMAX) 70 MG tablet Take 70 mg by mouth daily.      Marland Kitchen aspirin 81 MG tablet Take 81 mg by mouth daily.      Marland Kitchen atorvastatin (LIPITOR) 40 MG tablet Take 40 mg by mouth daily.      . benzonatate (TESSALON) 200 MG capsule Take 200 mg by mouth daily.      . Calcium Carbonate-Vitamin D 600-400 MG-UNIT per chew tablet Chew 1 tablet by mouth daily.      Marland Kitchen  ciprofloxacin (CIPRO) 250 MG tablet Take 250 mg by mouth daily.      . citalopram (CELEXA) 20 MG tablet Take 20 mg by mouth daily.      . diazepam (VALIUM) 5 MG tablet Take 5 mg by mouth every 6 (six) hours as needed for anxiety (Take one tablet before doctor's appt.).      Marland Kitchen donepezil (ARICEPT) 10 MG tablet Take 10 mg by mouth daily.      . Folic Acid-Vit G5-XMI W80 (FABB) 2.2-25-1 MG TABS Take 1 tablet by mouth daily.      Marland Kitchen NAMENDA 10 MG tablet Take 10 mg by mouth daily.      Marland Kitchen SPIRIVA HANDIHALER 18 MCG inhalation capsule Place 18 mcg into inhaler and inhale as needed.      . vitamin B-12 (CYANOCOBALAMIN) 1000 MCG tablet Take 1,000 mcg by mouth daily.        Allergies: Allergies as of  07/07/2013 - Review Complete 07/07/2013  Allergen Reaction Noted  . Hydrochlorothiazide  11/19/2012  . Tramadol  11/19/2012   Past Medical History  Diagnosis Date  . Backache, unspecified   . Full incontinence of feces   . Mixed hyperlipidemia   . History of acute myocardial infarction   . Cystocele, midline   . Coronary atherosclerosis of unspecified type of vessel, native or graft   . Osteoporosis, unspecified 07/03/12    Hip and foremar by DEXA  . 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 Hyperuricemia     Due to HCTZ  . Unspecified urinary incontinence   . Lipoprotein deficiencies   . Unspecified cataract   . History of TIA (transient ischemic attack) 2005  . Tobacco use disorder 09/01/2007  . Other B-complex deficiencies   . Other persistent mental disorders due to conditions classified elsewhere 08-2007  . Essential hypertension, benign   . Insomnia, unspecified   . Chronic kidney disease, stage III (moderate) 12/01/2007  . Memory deficits 11/19/2012  . Abnormality of gait 11/19/2012   Past Surgical History  Procedure Laterality Date  . Cataract extraction Bilateral    Family History  Problem Relation Age of Onset  . Heart attack Son    History   Social History  . Marital Status: Widowed    Spouse Name: N/A    Number of Children: 7  . Years of Education: 10   Occupational History  . Not on file.   Social History Main Topics  . Smoking status: Former Smoker    Quit date: 06/11/2011  . Smokeless tobacco: Not on file  . Alcohol Use: No  . Drug Use: No  . Sexual Activity: Not on file   Other Topics Concern  . Not on file   Social History Narrative  . No narrative on file    Review of Systems: Review of systems not obtained due to patient factors.  Physical Exam: Blood pressure 137/65, pulse 89, temperature 98.2 F (36.8 C), temperature source Oral, resp. rate 15, SpO2 99.00%.  Orthostatic Vital Signs: Lying: 169/82 HR  61 Sitting: 155/85 HR 78 Standing: 137/65 HR 89  Blood Pressure: Right: 130/52 Left:140/57  Physical Exam  Constitutional: She appears well-developed and well-nourished. No distress.  HENT:  Head: Normocephalic.  Mouth/Throat: Oropharynx is clear and moist. No oropharyngeal exudate.  Eyes: Pupils are equal, round, and reactive to light.  Cardiovascular: Normal rate, regular rhythm, normal heart sounds and intact distal pulses.  Exam reveals no friction rub.   No murmur heard.  Pulmonary/Chest: Effort normal. No respiratory distress. She has no wheezes. She has rales.  bibasilar crackles  Abdominal: Soft. Bowel sounds are normal. She exhibits no distension. There is no tenderness.  Neurological: She is alert. A cranial nerve deficit is present.  Oriented to person. Able to respond to simple commands. Does not answer complex questions.  PERRL, EOMI, moving both arms and legs spontaneously with no weakness.  Psychiatric:  Patient keeps eyes closed and only responds if directly addressed.   Lab results: Basic Metabolic Panel:  Recent Labs  07/07/13 1353  NA 139  K 4.3  CL 101  CO2 26  GLUCOSE 104*  BUN 11  CREATININE 1.45*  CALCIUM 9.0   Liver Function Tests:  Recent Labs  07/07/13 1353  AST 22  ALT 15  ALKPHOS 137*  BILITOT 0.4  PROT 7.4  ALBUMIN 3.2*   CBC:  Recent Labs  07/07/13 1353  WBC 11.8*  NEUTROABS 8.1*  HGB 12.4  HCT 36.5  MCV 90.6  PLT 153   Urinalysis:  Recent Labs  07/07/13 2038  COLORURINE YELLOW  LABSPEC 1.016  PHURINE 7.0  GLUCOSEU NEGATIVE  HGBUR NEGATIVE  BILIRUBINUR NEGATIVE  KETONESUR NEGATIVE  PROTEINUR NEGATIVE  UROBILINOGEN 1.0  NITRITE NEGATIVE  LEUKOCYTESUR NEGATIVE   Imaging results:  Ct Head Wo Contrast  07/07/2013   CLINICAL DATA:  Syncope.  EXAM: CT HEAD WITHOUT CONTRAST  TECHNIQUE: Contiguous axial images were obtained from the base of the skull through the vertex without intravenous contrast.  COMPARISON:   11/24/2012 and 02/24/2006  FINDINGS: Ventricles, cisterns and other CSF spaces are within normal. There is mild chronic ischemic microvascular disease and mild age related atrophic change. There is no focal mass, mass effect, shift of midline structures or acute hemorrhage. There is no evidence to suggest acute infarction. Remaining bones and soft tissues are within normal.  IMPRESSION: No acute intracranial findings.  Mild chronic ischemic microvascular disease and age related atrophic change.   Electronically Signed   By: Marin Olp M.D.   On: 07/07/2013 20:21   Dg Chest Portable 1 View  07/07/2013   CLINICAL DATA:  Abnormal EKG.  Back pain  EXAM: PORTABLE CHEST - 1 VIEW  COMPARISON:  12/08/2012  FINDINGS: Chronic lung disease with diffuse interstitial fibrosis. Improved lung volume compared with the prior study. No focal infiltrate or effusion is identified.  IMPRESSION: Chronic lung disease with interstitial fibrosis. No superimposed acute abnormality.   Electronically Signed   By: Franchot Gallo M.D.   On: 07/07/2013 14:04    Other results: EKG: LBBB.  Assessment & Plan by Problem: Active Problems:   Syncope  Syncope The patient's syncope is of unknown etiology at this time. The history is suggestive of a vaso-vagal syncope in the setting of orthostatic hypotension. This could certainly be due to medication side effects. However, more concerning causes include aortic dissection, MI, cardiac arrythmia, CVA, TIA, seizure are possible. Aortic dissection is less likely given no significant variance in BP between the arms 140/57 (Right) and 130/52 (Left), which is 83% sensitive for AD. Furthermore, the mediastinum is normal on CXR according to Dr. Annamaria Boots. The patient arrived with BP of 102/47 and was bolused with 500 cc NS. BP increased to 131/48. CT angiogram of the chest was considered but not oredered due to risk of contrast nephropathy (GFR 30's). The risk of contrast induced nephropathy for this  patient is 14% and the risk of dialysis is 0.12%. EKG is concerning for LBBB(no baseline  EKG for comparison), but there was a question about coorrect lead placement, repeating stat EKG. Initial troponin is negative x 2. CT head negative for acute intracranial process and similar to baseline. PE unlikely given [place] 1, [place] 0. Seizure is unlikely given the patients rapid return to baseline mental status. Cardiac dysrythmia is certainly possible given the patients history of prior MI.  - F/U EKG - Admit to tele - Trend Troponins and repeat EKG in am - ASA - Review Med list, and acquire records from PCP in [place]. Current psychoactive medications include valium 5 mg q 6 hr, citalopram 20 mg qd, aricept 10 mg qd, memantadine 10 mg qd, See Side Effect Profiles below  Valium SE:  Cardiovascular: Hypotension, localized phlebitis, vasodilatation Central nervous system: Amnesia, ataxia, confusion, depression, drowsiness, dysarthria, fatigue, headache, slurred speech, vertigo  Citalopram SE: Central nervous system: Somnolence (18%; dose related) Cardiovascular: QT prolongation (2%), hypotension (?1%), orthostatic hypotension (?1%), tachycardia (?1%), bradycardia (1%) Central nervous system: Fatigue (5%; dose related), anxiety (4%), agitation (3%), fever (2%), yawning (2%; dose related), amnesia (?1%), apathy (?1%), concentration impaired (?1%), confusion (?1%), depression (?1%), migraine (?1%), suicide attempt (?1%)  Memantadaine SE: Cardiovascular: Hypertension (4%), hypotension (extended release: 2%) Central nervous system: Dizziness (5% to 7%), confusion (6%), headache (6%), anxiety (extended release: 4%), depression (extended release: 3%), drowsiness (3%), hallucination (3%), pain (3%), aggressive behavior (2%), fatigue (2%)  Diarrhea The patient had apparently only one episode of diarrhea. However, she is on chronic ciprofloxacin for UTI which may predispose her to C. Diff. As she is currently  having orthostatic hypotension, plan to check a C. Dif Ag. - Strict I/Os - If diarrhea persists may consider further workup at that time.    CAD s/b MI Patient had MI 2 years ago according to family. Patient is on statin. Continue home statin and ASA. See above plan for syncope.  Vascular Dementia CT head negative for acute process.  Osteoporosis Holding home alendronate. May restart as outpatient.   D-Dimer Elevation A d-dimer was ordered when the patient was though to have variance between her right and left arms, which was concerning for possible AD. On further review, these BP were taken in the setting of orthostatic BPs. When the BP was repeated in both arms it was not significantly different (<20 mmHg). However, the D-Dimer resulted as 4.4. This elevated D-Dimer is likely due to known CKD, CAD. It is possibly due to other causes which have been addressed previously in this note. Diagnosis of acute aortic dissection by D-dimer: the International Registry of Acute Aortic Dissection Substudy on Biomarkers (IRAD-Bio) experience. AUSuzuki T, [place] A, [place] A, [place]im[place] S, [place], [place], [place] L, Gar[place] A, Sab[place] F, [place], [place], [place] JE, [place] F, [place], [place] E, [place], IRAD-Bio Investigators [employer]. [phone]  Code Status I spoke with the patients daughter in law on the phone. She has been the primary care giver to this patient for the last 4.5 years. The patient lives with her and her husbands, who is the patients son. The care giver stated that when the patient had her heart attack 2 years ago, the entire family met and decided that she should make the major medical decisions. The family discussed the patients wishes in regards to life support. The family decided that the patient would not want to be intubated or receive CPR if she were to stop breathing or her heart were to stop beating.  I have made  the  patient DNR and DNI.   Dispo: Disposition is deferred at this time, awaiting improvement of current medical problems. Anticipated discharge in approximately 1-2 day(s).   The patient does have a current PCP (Leonides Sake, MD) and does not need an [facility] hospital follow-up appointment after discharge.  The patient does have transportation limitations that hinder transportation to clinic appointments.  Signed: Marrion Coy, MD 07/07/2013, 9:39 PM

## 2013-07-08 ENCOUNTER — Encounter (HOSPITAL_COMMUNITY): Payer: Self-pay | Admitting: *Deleted

## 2013-07-08 DIAGNOSIS — R197 Diarrhea, unspecified: Secondary | ICD-10-CM

## 2013-07-08 DIAGNOSIS — M810 Age-related osteoporosis without current pathological fracture: Secondary | ICD-10-CM

## 2013-07-08 DIAGNOSIS — J449 Chronic obstructive pulmonary disease, unspecified: Secondary | ICD-10-CM | POA: Diagnosis present

## 2013-07-08 DIAGNOSIS — I2510 Atherosclerotic heart disease of native coronary artery without angina pectoris: Secondary | ICD-10-CM

## 2013-07-08 DIAGNOSIS — I252 Old myocardial infarction: Secondary | ICD-10-CM

## 2013-07-08 DIAGNOSIS — I517 Cardiomegaly: Secondary | ICD-10-CM

## 2013-07-08 DIAGNOSIS — F0150 Vascular dementia without behavioral disturbance: Secondary | ICD-10-CM

## 2013-07-08 DIAGNOSIS — N189 Chronic kidney disease, unspecified: Secondary | ICD-10-CM

## 2013-07-08 DIAGNOSIS — E785 Hyperlipidemia, unspecified: Secondary | ICD-10-CM | POA: Diagnosis present

## 2013-07-08 DIAGNOSIS — I672 Cerebral atherosclerosis: Secondary | ICD-10-CM

## 2013-07-08 LAB — CBC
HEMATOCRIT: 33.7 % — AB (ref 36.0–46.0)
HEMOGLOBIN: 11.2 g/dL — AB (ref 12.0–15.0)
MCH: 30.1 pg (ref 26.0–34.0)
MCHC: 33.2 g/dL (ref 30.0–36.0)
MCV: 90.6 fL (ref 78.0–100.0)
Platelets: 140 10*3/uL — ABNORMAL LOW (ref 150–400)
RBC: 3.72 MIL/uL — AB (ref 3.87–5.11)
RDW: 14.1 % (ref 11.5–15.5)
WBC: 9.1 10*3/uL (ref 4.0–10.5)

## 2013-07-08 LAB — BASIC METABOLIC PANEL
BUN: 12 mg/dL (ref 6–23)
CO2: 24 mEq/L (ref 19–32)
Calcium: 8.7 mg/dL (ref 8.4–10.5)
Chloride: 105 mEq/L (ref 96–112)
Creatinine, Ser: 1.34 mg/dL — ABNORMAL HIGH (ref 0.50–1.10)
GFR calc Af Amer: 42 mL/min — ABNORMAL LOW (ref >90)
GFR calc non Af Amer: 36 mL/min — ABNORMAL LOW (ref >90)
Glucose, Bld: 80 mg/dL (ref 70–99)
POTASSIUM: 3.8 meq/L (ref 3.7–5.3)
SODIUM: 142 meq/L (ref 137–147)

## 2013-07-08 LAB — TROPONIN I: Troponin I: <0.3 ng/mL (ref <0.30)

## 2013-07-08 LAB — CLOSTRIDIUM DIFFICILE BY PCR: Toxigenic C. Difficile by PCR: NEGATIVE

## 2013-07-08 NOTE — Progress Notes (Signed)
  Echocardiogram 2D Echocardiogram has been performed.  Kathleen Graham 07/08/2013, 11:21 AM

## 2013-07-08 NOTE — Discharge Summary (Signed)
Name: Kathleen Graham MRN: [id number] DOB: Dec 29, 1931 80 y.o. PCP: Ailene Ravel, MD  Date of Admission: 07/07/2013  1:33 PM Date of Discharge: 07/10/2013 Attending Physician: Debe Coder, MD  Discharge Diagnosis: Principal Problem:   Syncope Active Problems:   Memory deficits   Abnormality of gait   COPD (chronic obstructive pulmonary disease)   Other and unspecified hyperlipidemia   History of myocardial infarct at age greater than 60 years  Discharge Medications:   Medication List    STOP taking these medications       diazepam 5 MG tablet  Commonly known as:  VALIUM     donepezil 10 MG tablet  Commonly known as:  ARICEPT     HYDROcodone-acetaminophen 5-325 MG per tablet  Commonly known as:  NORCO/VICODIN     memantine 10 MG tablet  Commonly known as:  NAMENDA     nitrofurantoin 100 MG capsule  Commonly known as:  MACRODANTIN      TAKE these medications       acetaminophen 500 MG tablet  Commonly known as:  TYLENOL  Take 2 tablets (1,000 mg total) by mouth 3 (three) times daily as needed.     albuterol 108 (90 BASE) MCG/ACT inhaler  Commonly known as:  PROVENTIL HFA;VENTOLIN HFA  Inhale 2 puffs into the lungs every 4 (four) hours as needed for wheezing or shortness of breath.     albuterol (2.5 MG/3ML) 0.083% nebulizer solution  Commonly known as:  PROVENTIL  Take 2.5 mg by nebulization every 6 (six) hours as needed for wheezing.     alendronate 70 MG tablet  Commonly known as:  FOSAMAX  Take 70 mg by mouth once a week. Take with a full glass of water on an empty stomach every Tuesday.     aspirin 81 MG tablet  Take 81 mg by mouth daily.     atorvastatin 40 MG tablet  Commonly known as:  LIPITOR  Take 40 mg by mouth daily.     Calcium Carbonate-Vitamin D 600-400 MG-UNIT per chew tablet  Chew 1 tablet by mouth daily.     carvedilol 3.125 MG tablet  Commonly known as:  COREG  Take 3.125 mg by mouth 2 (two) times daily with a meal.      citalopram 20 MG tablet  Commonly known as:  CELEXA  Take 20 mg by mouth daily.     FABB 2.2-25-1 MG Tabs  Generic drug:  Folic Acid-Vit B6-Vit B12  Take 1 tablet by mouth daily.     nitroGLYCERIN 0.4 MG SL tablet  Commonly known as:  NITROSTAT  Place 0.4 mg under the tongue every 5 (five) minutes as needed for chest pain.     tiotropium 18 MCG inhalation capsule  Commonly known as:  SPIRIVA  Place 18 mcg into inhaler and inhale daily.     vitamin B-12 1000 MCG tablet  Commonly known as:  CYANOCOBALAMIN  Take 1,000 mcg by mouth daily.        Disposition and follow-up:   Kathleen Graham was discharged from [facility] in Stable condition.  At the hospital follow up visit please address:  1.  Patient will need to be set up with outpatient cardiac monitor. Cardiology will contact you regarding this appointment  2.  Labs / imaging needed at time of follow-up: Check BMP to assess continued renal function improvement. Check CBC to assess anemia and make sure it is stable.  3.  Pending labs/ test needing  follow-up: None  Follow-up Appointments: Follow-up Information   Follow up with [facility], MD On 07/22/2013. (10:45am)    Specialty:  Family Medicine   Contact information:   Dr. Burnell Blanks [address] [place] [address] [phone]       Follow up with [facility] On 07/15/2013. (10:00AM for event monitor placement)    Contact information:   [address] [place] [phone]      Discharge Instructions: Discharge Orders   Future Appointments Provider Department Dept Phone   07/15/2013 10:00 AM [facility] Office [phone]   Future Orders Complete By Expires   Call MD for:  difficulty breathing, headache or visual disturbances  As directed    Call MD for:  extreme fatigue  As directed    Call MD for:  persistant nausea and vomiting  As directed    Discharge  instructions  As directed    Comments:     Celexa, Aricept, Namenda, and Valium were held on admission and were continued to be held at discharge. You will need to discuss restarting these medications with your primary care provider.   Increase activity slowly  As directed       Consultations: Treatment Team:  Rounding Lbcardiology, MDcardiology  Procedures Performed:  Ct Head Wo Contrast  07/07/2013   CLINICAL DATA:  Syncope.  EXAM: CT HEAD WITHOUT CONTRAST  TECHNIQUE: Contiguous axial images were obtained from the base of the skull through the vertex without intravenous contrast.  COMPARISON:  11/24/2012 and 02/24/2006  FINDINGS: Ventricles, cisterns and other CSF spaces are within normal. There is mild chronic ischemic microvascular disease and mild age related atrophic change. There is no focal mass, mass effect, shift of midline structures or acute hemorrhage. There is no evidence to suggest acute infarction. Remaining bones and soft tissues are within normal.  IMPRESSION: No acute intracranial findings.  Mild chronic ischemic microvascular disease and age related atrophic change.   Electronically Signed   By: Elberta Fortis M.D.   On: 07/07/2013 20:21   Dg Chest Portable 1 View  07/07/2013   CLINICAL DATA:  Abnormal EKG.  Back pain  EXAM: PORTABLE CHEST - 1 VIEW  COMPARISON:  12/08/2012  FINDINGS: Chronic lung disease with diffuse interstitial fibrosis. Improved lung volume compared with the prior study. No focal infiltrate or effusion is identified.  IMPRESSION: Chronic lung disease with interstitial fibrosis. No superimposed acute abnormality.   Electronically Signed   By: Marlan Palau M.D.   On: 07/07/2013 14:04   2D echo 1/29: Study Conclusions  - Left ventricle: The cavity size was normal. Wall thickness was increased in a pattern of mild LVH. Systolic function was normal. The estimated ejection fraction was in the range of 55% to 60%. Wall motion was normal; there were  no regional wall motion abnormalities. There was an increased relative contribution of atrial contraction to ventricular filling. - Aortic valve: Mildly calcified annulus. Trileaflet; normal thickness leaflets. Trivial regurgitation. - Mitral valve: Valve area by pressure half-time: 0.72cm^2. - Pulmonary arteries: PA peak pressure: 31mm Hg (S).   Admission HPI:  Samara Stankowski is a 80 y.o. woman w/ pmhx of TIA, MI 2 years ago,n severe vascular dementia, HTN, HLD who presents with a cc of syncope. The patient was in her normal state of health. At baseline, she is a+o to person. This afternoon she had an episode of fecal incontinence, which is her baseline. However, this episode  was loose while she normally has solid stools. According to the family, this was the first episode of diarrhea. Of note, the patient is on chronic ABX therapy (ciprofloxacin) for chronic UTI. The patient was transferred to the shower the clean her up. She was placed on the commode by patients primary caregiver and she had a BM. She was then transferred to a shelf in the shower. At this time the patient called out in pain and told the daughter in law that she had pain between the shoulder blades. Apparently this pain is similar to her chronic back pains. While the patients daughter in law started to shower the patient, the patient lost consciousness. The daughter-in-law grabbed the patient and gently lowered her onto the floor. The patient was found to have no pulse and was not breathing. The patient was transferred from the shower to the bed. The patient received mouth to mouth breaths from a family member. The patient appears cyanotic and was unresponsive for 2-3 minutes. The patient regained consciousness and was breathing. She vomiting stomach contents x 2. EMS arrive on the scene and the patient was hypotensive at ~70/40's. No EMS EKG or rhythm strips were available for review from EMS however, EMS apparently reported ST  elevation on the rhythm strip. The patient was back to baseline mental status immediately after waking according to the family members. Upon arrival, BP was 102/47 and was subsequently bolused with 500 cc NS. BP increased to 131/48.   Of note, the daughter in law states that a similar event happened a few weeks ago. During that even the patient passed out briefly, but had no loss of pulse  Hospital Course by problem list:  Syncope with known hx of CHF, CAD: The patient's syncope is of unknown etiology at this time. Of note, ACS has been ruled out with three negative troponins. CT head negative. UA negative. CXR showed no acute process. EKG showed LBBB (stable from prior EKG July 2012 from her PCP's office). Blood pressure soft on admission (102/47) that responded well to 500cc NS bolus and they have been stable since (110-140/60s-70s). Given NS at 75cc/hr overnight as well. The history is suggestive of a vaso-vagal syncope in the setting of orthostatic hypotension. Of note, polypharmacy may be playing a role in orthostatic hypotension as patient is on a number of psychoactive medications (valium 5 mg q 6 hr, citalopram 20 mg qd, aricept 10 mg qd, memantadine 10 mg qd). May want to consider discontinuation of some of these medications as outpatient. She was monitored on telemetry during her stay, no events other than some scattered PVCs. Echo was performed on 1/29, which showed normal EF without AS. Given she has hx of prior CAD and MI with known LBBB, we are concerned primarily for a cardiac arrhythmia as the cause of her syncope. Cardiology evaluated patient and recommended arranging for 30 day event monitor as outpatient. This will be arranged by cardiology. Patient remained orthostatic on HD 3, therefore she was given NS at 100cc/hr x 5 hours and monitored overnight. Orthostatic vital signs were checked again in the morning and were normal. PT/OT evaluated patient and did not recommend further PT/OT  services. Cardiology felt that with her h/o LBBB, this raises concer for possibnle high grade AV block and felt that a 30 day cardiac event monitor would be be reasonable. I have spoken to Cardiology and they will contact the patient to set this up after discharge. Hold psychiatric medications including Aricept, Namenda, and Valium  on admission and continued to hold at discharge; also holding Vicodin, as she has not been on this since prior to admission, as these could have contributed to her syncopal episode. She will need to discuss restarting these medications with her PCP- per her caregiver, the family felt that she was on too many medications at home. Will restart SSRI at discharge, as to prevent discontinuation syndrome, and this will need to be tapered off by her PCP.  Diarrhea: Resolved. Patient had diarrhea x 1 episode prior to her syncopal episode. She had two loose bowel movements on HD 1. C diff negative. She is intermittently on ciprofloxacin for recurrent UTI, which may be contributing.  CAD w/ prior MI: Patient had MI in 2013, medical treatment only. Patient is on statin. Continued home statin and ASA on admission and discharge. See above plan for syncope.   H/o CHF, systolic: Patient w/ EF 40% 06/2011. She appeared euvolemic during her hospitalization. Repeat Echo during this admission showed EF 55-60%.   CKD III: Unknown baseline Cr. Cr 1.45 on admission, which improved to 1.34 after IVF (500cc NS bolus and 75cc/hr NS overnight). Per records from PCP, Cr 1.24 on 07/02/2013.  Vascular Dementia: CT head negative for acute process. Patient at baseline mental status per family. Holding Namenda and Aricept.  Osteoporosis: Held home alendronate on admission, which was restarted at discharge.   D-Dimer Elevation: A d-dimer was ordered when the patient was though to have variance between her right and left arms, which was concerning for possible AD. On further review, these BP were taken in the  setting of orthostatic BPs. When the BP was repeated in both arms it was not significantly different (<20 mmHg). However, the D-Dimer resulted as 4.4. This elevated D-Dimer is likely due to known CKD, CAD. Did not pursue further work up for PE given this was considered to be less likely than other etiologies.  Code Status: Dr. Meredith PelJoines spoke with patient's daughter, Olegario MessierKathy. She and her family have decided to keep the patient partial code. They are agreeable to vasoactive and cardiac medications, defibrillation, and CPR. They DO NOT WANT INTUBATION. Patient was made DNI in our system.  Discharge Vitals:   BP 121/54  Pulse 89  Temp(Src) 98.1 F (36.7 C) (Oral)  Resp 18  Ht 5\' 8"  (1.727 m)  Wt 149 lb (67.586 kg)  BMI 22.66 kg/m2  SpO2 95%  Discharge Labs:  No results found for this or any previous visit (from the past 24 hour(s)).  Signed: Genelle GatherGlenn, Ikhlas Albo F, MD Internal Medicine Resident, PGY II [facility] 07/10/2013 4:10 PM    Time Spent on Discharge: 35 minutes Services Ordered on Discharge: none Equipment Ordered on Discharge: none

## 2013-07-08 NOTE — Evaluation (Signed)
Physical Therapy Evaluation Patient Details Name: Kathleen Graham MRN: [id number] DOB: 1932/05/25 Today's Date: 07/08/2013 Time: 4098-1191 PT Time Calculation (min): 24 min  PT Assessment / Plan / Recommendation History of Present Illness  Adm s/p syncopal episode in shower (after having BM). ? cardiac related, although was orthostatic on admission and given IV fluids with good results PMHx includes severe dementia  Clinical Impression  Patient evaluated by Physical Therapy with no further acute PT needs identified.Family present and reports pt has 24 hour care and that she is at baseline for her mobility. PT is signing off. Thank you for this referral.     PT Assessment  Patent does not need any further PT services    Follow Up Recommendations  No PT follow up    Does the patient have the potential to tolerate intense rehabilitation      Barriers to Discharge        Equipment Recommendations  None recommended by PT    Recommendations for Other Services     Frequency      Precautions / Restrictions Precautions Precautions: Fall   Pertinent Vitals/Pain Denied dizziness      Mobility  Bed Mobility Overal bed mobility: Needs Assistance Bed Mobility: Rolling;Sidelying to Sit Rolling: Min guard Sidelying to sit: Min guard General bed mobility comments: pt requires tactile cues to follow through on instructions Transfers Overall transfer level: Needs assistance Equipment used: Rolling walker (2 wheeled) Transfers: Sit to/from Stand Sit to Stand: Min assist General transfer comment: steady assist to stand; guiding assist to fully turn before sitting at end of walk Ambulation/Gait Ambulation/Gait assistance: Min assist Ambulation Distance (Feet): 65 Feet Assistive device: Rolling walker (2 wheeled) Gait Pattern/deviations: Step-through pattern;Decreased stride length;Trunk flexed;Drifts right/left General Gait Details: vc for steering RW (tends to get distracted and  drift to one side) assist for balance x 1 (nearly crossed up her legs)    Exercises     PT Diagnosis:    PT Problem List:   PT Treatment Interventions:       PT Goals(Current goals can be found in the care plan section) Acute Rehab PT Goals PT Goal Formulation: No goals set, d/c therapy  Visit Information  Last PT Received On: 07/08/13 Assistance Needed: +1 History of Present Illness: Adm s/p syncopal episode in shower (after having BM). ? cardiac related, although was orthostatic on admission and given IV fluids with good results PMHx includes severe dementia       Prior Functioning  Home Living Family/patient expects to be discharged to:: Private residence Living Arrangements: Children Available Help at Discharge: Family;Available 24 hours/day Type of Home: House Home Access: Stairs to enter [employer] of Steps: 2 Home Layout: One level Home Equipment: Walker - 2 wheels;Bedside commode Additional Comments: daughter in law present and reports pt typically walks very little "doesn't want to" Prior Function Level of Independence: Needs assistance Gait / Transfers Assistance Needed: with RW with supervision to min assist Communication Communication: No difficulties    Cognition  Cognition Arousal/Alertness: Awake/alert Behavior During Therapy: WFL for tasks assessed/performed Overall Cognitive Status: History of cognitive impairments - at baseline    Extremity/Trunk Assessment Upper Extremity Assessment Upper Extremity Assessment: Overall WFL for tasks assessed Lower Extremity Assessment Lower Extremity Assessment: Overall WFL for tasks assessed Cervical / Trunk Assessment Cervical / Trunk Assessment: Kyphotic   Balance General Comments General comments (skin integrity, edema, etc.): Daughter in law observed and reports pt is at her baseline for mobility  End of  Session PT - End of Session Equipment Utilized During Treatment: Gait belt Activity  Tolerance: Patient tolerated treatment well Patient left: in bed;with call bell/phone within reach;with bed alarm set;with family/visitor present  GP     Claudean Leavelle 07/08/2013, 3:47 PM Pager [phone]

## 2013-07-08 NOTE — Progress Notes (Signed)
Patient refusing Heparin Injections. Patient and family was educated, but she refused all needles at this time. Will continue to monitor.  Valinda HoarLexie Francis Yardley RN

## 2013-07-08 NOTE — Evaluation (Signed)
Clinical/Bedside Swallow Evaluation Patient Details  Name: Kathleen Graham MRN: [id number] Date of Birth: 01-Oct-1931  Today's Date: 07/08/2013 Time: 0900-0913 SLP Time Calculation (min): 13 min  Past Medical History:  Past Medical History  Diagnosis Date  . Backache, unspecified   . Full incontinence of feces   . Mixed hyperlipidemia   . History of acute myocardial infarction   . Cystocele, midline   . Coronary atherosclerosis of unspecified type of vessel, native or graft   . Osteoporosis, unspecified 07/03/12    Hip and foremar by DEXA  . 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 Hyperuricemia     Due to HCTZ  . Unspecified urinary incontinence   . Lipoprotein deficiencies   . Unspecified cataract   . History of TIA (transient ischemic attack) 2005  . Tobacco use disorder 09/01/2007  . Other B-complex deficiencies   . Other persistent mental disorders due to conditions classified elsewhere 08-2007  . Essential hypertension, benign   . Insomnia, unspecified   . Chronic kidney disease, stage III (moderate) 12/01/2007  . Memory deficits 11/19/2012  . Abnormality of gait 11/19/2012   Past Surgical History:  Past Surgical History  Procedure Laterality Date  . Cataract extraction Bilateral    HPI:  80 y.o. woman w/ pmhx of TIA, MI 2 years ago,n severe vascular dementia, HTN, HLD who presents with a cc of syncope. The patient was in her normal state of health when this afternoon she had an episode of loose fecal incontinence. She was transferred to a shelf in the shower and patient called out in pain and told the daughter in law that she had pain between the shoulder blades and pt. lost consciousness and had no pulse and was not breathing. The patient received mouth to mouth breaths from a family member and  was unresponsive for 2-3 minutes. The patient regained consciousness and was breathing. She vomiting stomach contents x 2.  CXR Chronic lung disease with  interstitial fibrosis. No superimposed acute abnormality.  Head CT  no acute abnormality.   Assessment / Plan / Recommendation Clinical Impression  Pt. exhibits an oral-motor pattern typical for pt.'s with dementia including mandible movements at rest (without po's) and prolonged oral prep, mastication and transit with regular texture.  No s/s aspiration during assessment.  Recommend Dys 2 diet texture and thin liquids, straws ok, pills are crushed at home, full supervision assist and check for pocketing.  ST will see short term for diet appropriateness, upgrade and family education.    Aspiration Risk  Moderate    Diet Recommendation Dysphagia 2 (Fine chop);Thin liquid   Liquid Administration via: Straw;Cup Medication Administration: Crushed with puree Supervision: Full supervision/cueing for compensatory strategies;Patient able to self feed Compensations: Slow rate;Small sips/bites;Check for pocketing Postural Changes and/or Swallow Maneuvers: Seated upright 90 degrees;Upright 30-60 min after meal    Other  Recommendations Oral Care Recommendations: Oral care BID   Follow Up Recommendations  None    Frequency and Duration min 1 x/week  2 weeks   Pertinent Vitals/Pain WDL         Swallow Study         Oral/Motor/Sensory Function Overall Oral Motor/Sensory Function: Appears within functional limits for tasks assessed   Ice Chips Ice chips: Not tested   Thin Liquid Thin Liquid: Within functional limits Presentation: Cup;Straw    Nectar Thick Nectar Thick Liquid: Not tested   Honey Thick Honey Thick Liquid: Not tested   Puree Puree: Within functional  limits   Solid   GO    Solid: Impaired Oral Phase Impairments: Impaired mastication Oral Phase Functional Implications:  (delayed prep and transit)       Royce MacadamiaLisa Willis Cerrone Debold M.Ed [employer] Pager [phone]  07/08/2013

## 2013-07-08 NOTE — ED Provider Notes (Signed)
I saw and evaluated the patient, reviewed the resident's note and I agree with the findings, including EKG interpretation, and plan.  Patient seen after syncopal episode. Initial workup did not show any obvious cardiac etiology. No obvious sign of stroke. Because of unexplained syncope, patient will require hospitalization for further evaluation.     Gilda Creasehristopher J. Pollina, MD 07/08/13 [phone]

## 2013-07-08 NOTE — H&P (Signed)
Internal Medicine Attending Admission Note Date: 07/08/2013  Patient name: Kathleen Graham Medical record number: [id number] Date of birth: Sep 08, 1931 Age: 80 y.o. Gender: female  I saw and evaluated the patient. I reviewed the resident's note and I agree with the resident's findings and plan as documented in the resident's note, with the following additional comments.  Chief Complaint(s): Syncope  History - key components related to admission: Patient is an 80 year old woman with history of TIA, status post MI, advanced dementia, hypertension, hyperlipidemia, and other problems as outlined in the medical history, admitted following an episode of syncope that occurred at home.  Patient was being assisted with a bath when she lost consciousness and apparently stopped breathing; a family member gave brief mouth-to-mouth resuscitation and patient responded and started breathing.  She is now alert with no distress, oriented to person only which according to her family is her baseline, and has no complaint.  She denies chest pain, abdominal pain, shortness of breath.  Physical Exam - key components related to admission:  Filed Vitals:   07/07/13 2245 07/07/13 2300 07/07/13 2330 07/08/13 0529  BP: 130/52 140/57 155/58 138/66  Pulse: 67  62 76  Temp:   98.9 F (37.2 C) 98.6 F (37 C)  TempSrc:   Oral Oral  Resp: 16  18 16   Height:   5\' 8"  (1.727 m)   Weight:   149 lb (67.586 kg)   SpO2: 97%  97% 99%   General: Alert, disoriented, no distress Lungs: Clear Heart: Regular; no extra sounds or murmurs Abdomen: Bowel sounds present, soft, nontender Extremities: No edema   Lab results:   Basic Metabolic Panel:  Recent Labs  40/98/1101/28/15 1353 07/08/13 0313  NA 139 142  K 4.3 3.8  CL 101 105  CO2 26 24  GLUCOSE 104* 80  BUN 11 12  CREATININE 1.45* 1.34*  CALCIUM 9.0 8.7    Liver Function Tests:  Recent Labs  07/07/13 1353  AST 22  ALT 15  ALKPHOS 137*  BILITOT 0.4  PROT 7.4   ALBUMIN 3.2*    CBC:  Recent Labs  07/07/13 1353 07/08/13 0313  WBC 11.8* 9.1  HGB 12.4 11.2*  HCT 36.5 33.7*  MCV 90.6 90.6  PLT 153 140*    Recent Labs  07/07/13 1353  NEUTROABS 8.1*  LYMPHSABS 2.5  MONOABS 0.8  EOSABS 0.4  BASOSABS 0.0    Cardiac Enzymes:  Recent Labs  07/07/13 2104  TROPONINI <0.30    D-Dimer:  Recent Labs  07/07/13 2222  DDIMER 4.44*     Urinalysis    Component Value Date/Time   COLORURINE YELLOW 07/07/2013 2038   APPEARANCEUR CLEAR 07/07/2013 2038   LABSPEC 1.016 07/07/2013 2038   PHURINE 7.0 07/07/2013 2038   GLUCOSEU NEGATIVE 07/07/2013 2038   HGBUR NEGATIVE 07/07/2013 2038   BILIRUBINUR NEGATIVE 07/07/2013 2038   KETONESUR NEGATIVE 07/07/2013 2038   PROTEINUR NEGATIVE 07/07/2013 2038   UROBILINOGEN 1.0 07/07/2013 2038   NITRITE NEGATIVE 07/07/2013 2038   LEUKOCYTESUR NEGATIVE 07/07/2013 2038     Imaging results:  Ct Head Wo Contrast  07/07/2013   CLINICAL DATA:  Syncope.  EXAM: CT HEAD WITHOUT CONTRAST  TECHNIQUE: Contiguous axial images were obtained from the base of the skull through the vertex without intravenous contrast.  COMPARISON:  11/24/2012 and 02/24/2006  FINDINGS: Ventricles, cisterns and other CSF spaces are within normal. There is mild chronic ischemic microvascular disease and mild age related atrophic change. There is no focal mass, mass effect,  shift of midline structures or acute hemorrhage. There is no evidence to suggest acute infarction. Remaining bones and soft tissues are within normal.  IMPRESSION: No acute intracranial findings.  Mild chronic ischemic microvascular disease and age related atrophic change.   Electronically Signed   By: Elberta Fortis M.D.   On: 07/07/2013 20:21   Dg Chest Portable 1 View  07/07/2013   CLINICAL DATA:  Abnormal EKG.  Back pain  EXAM: PORTABLE CHEST - 1 VIEW  COMPARISON:  12/08/2012  FINDINGS: Chronic lung disease with diffuse interstitial fibrosis. Improved lung volume compared  with the prior study. No focal infiltrate or effusion is identified.  IMPRESSION: Chronic lung disease with interstitial fibrosis. No superimposed acute abnormality.   Electronically Signed   By: Marlan Palau M.D.   On: 07/07/2013 14:04    Other results: EKG 1/28: Suspect arm lead reversal, interpretation assumes no reversal; sinus bradycardia; non-specific intra-ventricular conduction block; lateral infarct, age undetermined  EKG 1/29: Normal sinus rhythm; left axis deviation; left bundle branch block; no significant change since last tracing  Assessment & Plan by Problem:  1.  Syncope, etiology unclear.  Patient's family report a brief period of respiratory arrest, and that she started breathing again after a very brief attempt at mouth-to-mouth resuscitation by a family member who is not trained in CPR.  This may have been an arrhythmic event.  Patient denies chest pain, and there is no evidence of acute coronary syndrome by enzymes or EKG.  A 2-D echocardiogram today showed normal left ventricular function and normal wall motion.  Patient's d-dimer is elevated, but she has no symptoms to suggest pulmonary embolism.  Plan is monitor on telemetry; obtain outside records from recent hospitalization for syncope at [facility]; it would be helpful to obtain the EMS report; consider cardiology consult for consideration of event monitor.    2.  Diarrhea.  Patient is on chronic ciprofloxacin, and the plan is check C. difficile toxin.  3.  Code status.  I discussed patient by phone with her daughter this morning, who initially requested that patient be kept at full code status until she could discuss the question with her siblings.  I talked with her again after her arrival to the hospital today, and she stated that she had discussed the issue with her siblings and that everyone agrees that patient should not be intubated or put on mechanical ventilation which is in accord with patient's previously  expressed wishes.  They do want other measures done, so code status was changed to partial code (no intubation or mechanical ventilation).  4.  See resident physician's note for other problems and plans.

## 2013-07-08 NOTE — Progress Notes (Addendum)
Subjective: Patient has no complaints today. She has no pain and is very talkative and in good spirits. She was able to follow commands well, though was alert and oriented only to self.   Objective: Vital signs in last 24 hours: Filed Vitals:   07/07/13 2300 07/07/13 2330 07/08/13 0529 07/08/13 1355  BP: 140/57 155/58 138/66 117/68  Pulse:  62 76 80  Temp:  98.9 F (37.2 C) 98.6 F (37 C) 98.7 F (37.1 C)  TempSrc:  Oral Oral Oral  Resp:  18 16 18   Height:  5\' 8"  (1.727 m)    Weight:  149 lb (67.586 kg)    SpO2:  97% 99% 99%   Weight change:   Intake/Output Summary (Last 24 hours) at 07/08/13 1423 Last data filed at 07/08/13 0900  Gross per 24 hour  Intake    360 ml  Output    210 ml  Net    150 ml   Physical Exam General: elderly woman who looks to be fairly well nourished and cared for; she is alert, cooperative, and in no apparent distress HEENT: pupils equal round and reactive to light, vision grossly intact, oropharynx clear and non-erythematous  Neck: supple Lungs: clear to ascultation bilaterally, normal work of respiration, no wheezes, rales, ronchi Heart: regular rate and rhythm, no murmurs, gallops, or rubs Abdomen: soft, non-tender, non-distended, normal bowel sounds Extremities: warm extremities w/ good DP pulses bilaterally; no BLE edema Neurologic: alert & oriented X3, answers some questions inappropriately, but speech is coherent; she is able to follow commands well; cranial nerves II-XII grossly intact, strength grossly intact, sensation intact to light touch  Lab Results: Basic Metabolic Panel:  Recent Labs Lab 07/07/13 1353 07/08/13 0313  NA 139 142  K 4.3 3.8  CL 101 105  CO2 26 24  GLUCOSE 104* 80  BUN 11 12  CREATININE 1.45* 1.34*  CALCIUM 9.0 8.7   Liver Function Tests:  Recent Labs Lab 07/07/13 1353  AST 22  ALT 15  ALKPHOS 137*  BILITOT 0.4  PROT 7.4  ALBUMIN 3.2*   CBC:  Recent Labs Lab 07/07/13 1353 07/08/13 0313    WBC 11.8* 9.1  NEUTROABS 8.1*  --   HGB 12.4 11.2*  HCT 36.5 33.7*  MCV 90.6 90.6  PLT 153 140*   Cardiac Enzymes:  Recent Labs Lab 07/07/13 2104 07/08/13 1115  TROPONINI <0.30 <0.30   D-Dimer:  Recent Labs Lab 07/07/13 2222  DDIMER 4.44*   Urinalysis:  Recent Labs Lab 07/07/13 2038  COLORURINE YELLOW  LABSPEC 1.016  PHURINE 7.0  GLUCOSEU NEGATIVE  HGBUR NEGATIVE  BILIRUBINUR NEGATIVE  KETONESUR NEGATIVE  PROTEINUR NEGATIVE  UROBILINOGEN 1.0  NITRITE NEGATIVE  LEUKOCYTESUR NEGATIVE    Micro Results: No results found for this or any previous visit (from the past 240 hour(s)). Studies/Results: Ct Head Wo Contrast  07/07/2013   CLINICAL DATA:  Syncope.  EXAM: CT HEAD WITHOUT CONTRAST  TECHNIQUE: Contiguous axial images were obtained from the base of the skull through the vertex without intravenous contrast.  COMPARISON:  11/24/2012 and 02/24/2006  FINDINGS: Ventricles, cisterns and other CSF spaces are within normal. There is mild chronic ischemic microvascular disease and mild age related atrophic change. There is no focal mass, mass effect, shift of midline structures or acute hemorrhage. There is no evidence to suggest acute infarction. Remaining bones and soft tissues are within normal.  IMPRESSION: No acute intracranial findings.  Mild chronic ischemic microvascular disease and age related atrophic change.  Electronically Signed   By: Elberta Fortisaniel  Boyle M.D.   On: 07/07/2013 20:21   Dg Chest Portable 1 View  07/07/2013   CLINICAL DATA:  Abnormal EKG.  Back pain  EXAM: PORTABLE CHEST - 1 VIEW  COMPARISON:  12/08/2012  FINDINGS: Chronic lung disease with diffuse interstitial fibrosis. Improved lung volume compared with the prior study. No focal infiltrate or effusion is identified.  IMPRESSION: Chronic lung disease with interstitial fibrosis. No superimposed acute abnormality.   Electronically Signed   By: Marlan Palauharles  Clark M.D.   On: 07/07/2013 14:04   Medications: I have  reviewed the patient's current medications. Scheduled Meds: . heparin  5,000 Units Subcutaneous Q8H  . sodium chloride  3 mL Intravenous Q12H  . tiotropium  18 mcg Inhalation Daily   Continuous Infusions: . sodium chloride 75 mL/hr at 07/08/13 0023   PRN Meds:.acetaminophen, acetaminophen, albuterol, ondansetron (ZOFRAN) IV, ondansetron  Assessment/Plan: Syncope: The patient's syncope is of unknown etiology at this time. Of note, ACS has been ruled out with three negative troponins. CT head negative. CXR showed no acute process. EKG showed LBBB (stable from prior EKG July 2012 from her PCP's office). Blood pressure soft on admission (102/47) that responded well to 500cc NS bolus and they have been stable since. Given NS at 75cc/hr overnight as well. The history is suggestive of a vaso-vagal syncope in the setting of orthostatic hypotension (patient orthostatic by DBP from sitting to standing only). Other possibilities include cardiac arrhythmia (not sure how old her LBBB is), CVA, MI, seizure, PE.  Per chart review, patient has known sCHF, EF 40% w/ area of akinesis over apex (c/w prior MI), there was no AS. Based on the story I heard from her family, I am most suspicious for cardiac etiology. She was monitored on telemetry overnight and there were no abnormalities or arrhythmias seen (only a few scattered PVCs). We will evaluate for cardiac structural abnormalities w/ 2D echo today and go from there. The D dimer that was ordered overnight was elevated, but was ordered using false information. Patient scores very low probability on modified Geneva and Wells scores, so I will not pursue further work up for PE at this time as patient has significant kidney disease and I feel as though the risk of CIN is higher than potential benefit of CTA chest. Polypharmacy may be playing a role as patient is on a number of psychoactive medications (valium 5 mg q 6 hr, citalopram 20 mg qd, aricept 10 mg qd, memantadine  10 mg qd). - telemetry  - ASA  -SLP evaluated patient and approved Dys 2 diet - d/c IVF as patient is tolerating PO now - PT/OT to eval -assess medication list and make changes as needed -obtained records from her PCP (Dr. Nathanial RancherHamrick in [place], [place] [phone])  Diarrhea: Patient has had two loose bowel movements since her admission. Sending C diff Ag since she is on chronic ciprofloxacin for recurrent UTI. -C diff ag pending -Strict I/Os   CAD w/ prior MI: Patient had MI 2 years ago according to family. Patient is on statin. Continue home statin and ASA. See above plan for syncope.   CKD: Unknown baseline Cr. Cr 1.45 on admission. Today Cr 1.34 after IVF (500cc NS bolus and 75cc/hr NS overnight).  Vascular Dementia: CT head negative for acute process. Patient is at baseline mental status per family.  Osteoporosis: Holding home alendronate. May restart as outpatient.   Code Status: Dr. Meredith PelJoines spoke with patient's daughter, Olegario MessierKathy. She  and her family have decided to keep the patient partial code. They are agreeable to vasoactive and cardiac medications, defibrillation, and CPR. They DO NOT WANT INTUBATION. Patient was made DNI in our system.  Dispo: Disposition is deferred at this time, awaiting improvement of current medical problems.  Anticipated discharge in approximately 1-2 day(s).   The patient does have a current PCP (Ailene Ravel, MD) and does not need an [facility] hospital follow-up appointment after discharge.  The patient does not have transportation limitations that hinder transportation to clinic appointments.  .Services Needed at time of discharge: Y = Yes, Blank = No PT:   OT:   RN:   Equipment:   Other:     LOS: 1 day   Windell Hummingbird, MD 07/08/2013, 2:23 PM

## 2013-07-09 ENCOUNTER — Other Ambulatory Visit: Payer: Self-pay | Admitting: Physician Assistant

## 2013-07-09 DIAGNOSIS — I469 Cardiac arrest, cause unspecified: Secondary | ICD-10-CM

## 2013-07-09 DIAGNOSIS — R55 Syncope and collapse: Secondary | ICD-10-CM

## 2013-07-09 MED ORDER — SODIUM CHLORIDE 0.9 % IV SOLN
INTRAVENOUS | Status: AC
  Administered 2013-07-09: 13:00:00 via INTRAVENOUS

## 2013-07-09 NOTE — Consult Note (Signed)
Referring Physician:  Primary Physician: Primary Cardiologist: Reason for Consultation:    HPI:  Kathleen Graham is a 80 y.o. woman with h/o TIA, COPD, ? MI 2 years ago, severe vascular dementia, HTN, HLD who was admitted with syncope/respiratory arrest.   According to the notes and family, patient was in her normal state of health (can ambulate around house with help) when she developed episode of fecal incontinence, which is chronic. The patient was transferred to the shower the clean her up. She was placed on the commode by patients primary caregiver and she had a BM. She was then transferred to a shelf in the shower. At this time the patient called out in pain and told the daughter in law that she had pain between the shoulder blades. Apparently this pain is similar to her chronic back pains. While the patients daughter in law started to shower the patient, the patient lost consciousness. The daughter-in-law grabbed the patient and gently lowered her onto the floor. The patient was found to have no pulse and was not breathing. The patient was transferred from the shower to the bed. The patient received mouth to mouth breaths from a family member. The patient appears cyanotic and was unresponsive for 2-3 minutes. The patient regained consciousness and was breathing. She vomiting stomach contents x 2. EMS arrive on the scene and the patient was hypotensive at ~70/40's.The patient was back to baseline mental status immediately after waking according to the family members. Upon arrival, BP was 102/47 and was subsequently bolused with 500 cc NS. BP increased to 131/48. Of note, the daughter in law states that she had syncopal episode 6 months ago without loss of pulse.  They tell me she was [facility] years ago with CP and there was some controversy over whether or not she had an MI. No cath or stress test done.   While in hospital had echo with normal LV and RV function.  Troponins normal but d-dimer 4.4. Tele with sinus rhythm with occasional multifocal PVCs and couplets. No pause.   ECG: NSR 61 LBBB 144 ms PR 148 ms   Review of Systems: Poor historian. Unable to elicit except for HPI from family.  Past Medical History  Diagnosis Date  . Backache, unspecified   . Full incontinence of feces   . Mixed hyperlipidemia   . History of acute myocardial infarction   . Cystocele, midline   . Coronary atherosclerosis of unspecified type of vessel, native or graft   . Osteoporosis, unspecified 07/03/12    Hip and foremar by DEXA  . 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 Hyperuricemia     Due to HCTZ  . Unspecified urinary incontinence   . Lipoprotein deficiencies   . Unspecified cataract   . History of TIA (transient ischemic attack) 2005  . Tobacco use disorder 09/01/2007  . Other B-complex deficiencies   . Other persistent mental disorders due to conditions classified elsewhere 08-2007  . Essential hypertension, benign   . Insomnia, unspecified   . Chronic kidney disease, stage III (moderate) 12/01/2007  . Memory deficits 11/19/2012  . Abnormality of gait 11/19/2012    Medications Prior to Admission  Medication Sig Dispense Refill  . acetaminophen (TYLENOL) 500 MG tablet Take 1,000 mg by mouth 3 (three) times daily.      Marland Kitchen. albuterol (PROVENTIL HFA;VENTOLIN HFA) 108 (90 BASE) MCG/ACT inhaler Inhale 2 puffs into the lungs every 4 (four) hours as needed  for wheezing or shortness of breath.      Marland Kitchen alendronate (FOSAMAX) 70 MG tablet Take 70 mg by mouth once a week. Take with a full glass of water on an empty stomach every Tuesday.      Marland Kitchen aspirin 81 MG tablet Take 81 mg by mouth daily.      Marland Kitchen atorvastatin (LIPITOR) 40 MG tablet Take 40 mg by mouth daily.      . Calcium Carbonate-Vitamin D 600-400 MG-UNIT per chew tablet Chew 1 tablet by mouth daily.      . carvedilol (COREG) 3.125 MG tablet Take 3.125 mg by mouth 2 (two) times daily with a  meal.      . citalopram (CELEXA) 20 MG tablet Take 20 mg by mouth daily.      . diazepam (VALIUM) 5 MG tablet Take 5 mg by mouth every 6 (six) hours as needed for anxiety.      . donepezil (ARICEPT) 10 MG tablet Take 10 mg by mouth daily.      . Folic Acid-Vit B6-Vit B12 (FABB) 2.2-25-1 MG TABS Take 1 tablet by mouth daily.      Marland Kitchen HYDROcodone-acetaminophen (NORCO/VICODIN) 5-325 MG per tablet Take 0.5 tablets by mouth 2 (two) times daily as needed for moderate pain.      . memantine (NAMENDA) 10 MG tablet Take 10 mg by mouth 2 (two) times daily.      . nitrofurantoin (MACRODANTIN) 100 MG capsule Take 100 mg by mouth daily.      . nitroGLYCERIN (NITROSTAT) 0.4 MG SL tablet Place 0.4 mg under the tongue every 5 (five) minutes as needed for chest pain.      Marland Kitchen tiotropium (SPIRIVA) 18 MCG inhalation capsule Place 18 mcg into inhaler and inhale daily.      . vitamin B-12 (CYANOCOBALAMIN) 1000 MCG tablet Take 1,000 mcg by mouth daily.      Marland Kitchen albuterol (PROVENTIL) (2.5 MG/3ML) 0.083% nebulizer solution Take 2.5 mg by nebulization every 6 (six) hours as needed for wheezing.         . heparin  5,000 Units Subcutaneous Q8H  . sodium chloride  3 mL Intravenous Q12H  . tiotropium  18 mcg Inhalation Daily    Infusions: . sodium chloride 100 mL/hr at 07/09/13 1237    Allergies  Allergen Reactions  . Hydrochlorothiazide     Hyperurlcemia  . Tramadol     Leg weakness    History   Social History  . Marital Status: Widowed    Spouse Name: N/A    Number of Children: 7  . Years of Education: 10   Occupational History  . Not on file.   Social History Main Topics  . Smoking status: Former Smoker    Quit date: 06/11/2011  . Smokeless tobacco: Not on file  . Alcohol Use: No  . Drug Use: No  . Sexual Activity: Not on file   Other Topics Concern  . Not on file   Social History Narrative  . No narrative on file    Family History  Problem Relation Age of Onset  . Heart attack Son      PHYSICAL EXAM: Filed Vitals:   07/09/13 0920  BP: 110/63  Pulse: 102  Temp:   Resp:      Intake/Output Summary (Last 24 hours) at 07/09/13 1316 Last data filed at 07/09/13 0900  Gross per 24 hour  Intake 1996.25 ml  Output      1 ml  Net 1995.25 ml  General:  Elderly chronically ill appearing. Lethargic. Opens eyes to voice but then closes them  HEENT: normal Neck: supple. no JVD. Carotids 2+ bilat  No lymphadenopathy or thryomegaly appreciated. Cor: PMI nondisplaced. Regular rate & rhythm. 2/6 SEM RSB Lungs: clear decreased BS Abdomen: soft, nontender, nondistended. No hepatosplenomegaly. No bruits or masses. Good bowel sounds. Extremities: no cyanosis, clubbing, rash, edema Neuro: somnolent. Opens eyes. Mumbles a bit. Otherwise non-focal   No results found for this or any previous visit (from the past 24 hour(s)). Ct Head Wo Contrast  07/07/2013   CLINICAL DATA:  Syncope.  EXAM: CT HEAD WITHOUT CONTRAST  TECHNIQUE: Contiguous axial images were obtained from the base of the skull through the vertex without intravenous contrast.  COMPARISON:  11/24/2012 and 02/24/2006  FINDINGS: Ventricles, cisterns and other CSF spaces are within normal. There is mild chronic ischemic microvascular disease and mild age related atrophic change. There is no focal mass, mass effect, shift of midline structures or acute hemorrhage. There is no evidence to suggest acute infarction. Remaining bones and soft tissues are within normal.  IMPRESSION: No acute intracranial findings.  Mild chronic ischemic microvascular disease and age related atrophic change.   Electronically Signed   By: Elberta Fortis M.D.   On: 07/07/2013 20:21   Dg Chest Portable 1 View  07/07/2013   CLINICAL DATA:  Abnormal EKG.  Back pain  EXAM: PORTABLE CHEST - 1 VIEW  COMPARISON:  12/08/2012  FINDINGS: Chronic lung disease with diffuse interstitial fibrosis. Improved lung volume compared with the prior study. No focal infiltrate  or effusion is identified.  IMPRESSION: Chronic lung disease with interstitial fibrosis. No superimposed acute abnormality.   Electronically Signed   By: Marlan Palau M.D.   On: 07/07/2013 14:04     ASSESSMENT: 1. Recurrent syncope/cardiorespiratory arrest 2. Dementia 3. H/o MI 4. LBBB 5. PVCs  PLAN/DISCUSSION:  Etiology of syncope/arrest is unclear. With normal troponins I suspect this is not driven by myocardial ischemia. May likely be vagal episode in setting of diarrhea and showering but need to exclude more malignant underlying arrhythmias or pauses. Agree with continuing her on tele while in hospital and can consider an event monitor on discharge. I discussed briefly the possibility of a pacemaker (no indication for it currently) with the family if monitor shows bradycardia/pauses but they were not overly interested in her having any procedures. With elevated d-dimer could consider work-up for PE but no clinical evidence of RV strain or respiratory compromise so I don't feel strongly about this.   At this point I think support care is the best approach. Given her comorbidities may be reasonable to discuss code status with family in the event that this happens again.   We will follow.   Daniel Bensimhon,MD 1:48 PM

## 2013-07-09 NOTE — Progress Notes (Addendum)
Subjective: Patient has no complaints today. She is tired since she states she didn't sleep well. Nurse recheck orthostatics today and patient remains orthostatic. We also check orthostatics during our team exam and patient is orthostatic by SBP (22 systolic blood pressure points).  Objective: Vital signs in last 24 hours: Filed Vitals:   07/09/13 0544 07/09/13 0915 07/09/13 0917 07/09/13 0920  BP: 140/75 151/68 140/77 110/63  Pulse: 102 89 97 102  Temp: 98.9 F (37.2 C) 98.3 F (36.8 C)    TempSrc: Oral Oral    Resp: 18 18    Height:      Weight:      SpO2: 99% 100% 100% 99%  Lying BP 137/54  HR 87 Standing after two minutes: BP 115/59   HR 104  Weight change:   Intake/Output Summary (Last 24 hours) at 07/09/13 1303 Last data filed at 07/09/13 0900  Gross per 24 hour  Intake 1996.25 ml  Output      1 ml  Net 1995.25 ml   Physical Exam General: alert, cooperative, NAD HEENT: NCAT, vision grossly intact, oropharynx clear and non-erythematous  Neck: supple Lungs: clear to ascultation bilaterally, normal work of respiration, no wheezes, rales, ronchi Heart: regular rate and rhythm, no murmurs, gallops, or rubs Abdomen: soft, non-tender, non-distended, normal bowel sounds Extremities: warm extremities w/ good DP pulses bilaterally; no BLE edema Neurologic: alert & oriented X3, answers some questions inappropriately, but speech is coherent; she is able to follow commands well; cranial nerves II-XII grossly intact, strength grossly intact (she is able to move from lying position to standing with minimal assistance, sensation intact to light touch  Lab Results: Basic Metabolic Panel:  Recent Labs Lab 07/07/13 1353 07/08/13 0313  NA 139 142  K 4.3 3.8  CL 101 105  CO2 26 24  GLUCOSE 104* 80  BUN 11 12  CREATININE 1.45* 1.34*  CALCIUM 9.0 8.7   Liver Function Tests:  Recent Labs Lab 07/07/13 1353  AST 22  ALT 15  ALKPHOS 137*  BILITOT 0.4  PROT 7.4  ALBUMIN  3.2*   CBC:  Recent Labs Lab 07/07/13 1353 07/08/13 0313  WBC 11.8* 9.1  NEUTROABS 8.1*  --   HGB 12.4 11.2*  HCT 36.5 33.7*  MCV 90.6 90.6  PLT 153 140*   Cardiac Enzymes:  Recent Labs Lab 07/07/13 2104 07/08/13 1115  TROPONINI <0.30 <0.30   D-Dimer:  Recent Labs Lab 07/07/13 2222  DDIMER 4.44*   Urinalysis:  Recent Labs Lab 07/07/13 2038  COLORURINE YELLOW  LABSPEC 1.016  PHURINE 7.0  GLUCOSEU NEGATIVE  HGBUR NEGATIVE  BILIRUBINUR NEGATIVE  KETONESUR NEGATIVE  PROTEINUR NEGATIVE  UROBILINOGEN 1.0  NITRITE NEGATIVE  LEUKOCYTESUR NEGATIVE    Micro Results: Recent Results (from the past 240 hour(s))  CLOSTRIDIUM DIFFICILE BY PCR     Status: None   Collection Time    07/08/13  1:07 PM      Result Value Range Status   C difficile by pcr NEGATIVE  NEGATIVE Final   Studies/Results: Ct Head Wo Contrast  07/07/2013   CLINICAL DATA:  Syncope.  EXAM: CT HEAD WITHOUT CONTRAST  TECHNIQUE: Contiguous axial images were obtained from the base of the skull through the vertex without intravenous contrast.  COMPARISON:  11/24/2012 and 02/24/2006  FINDINGS: Ventricles, cisterns and other CSF spaces are within normal. There is mild chronic ischemic microvascular disease and mild age related atrophic change. There is no focal mass, mass effect, shift of midline structures  or acute hemorrhage. There is no evidence to suggest acute infarction. Remaining bones and soft tissues are within normal.  IMPRESSION: No acute intracranial findings.  Mild chronic ischemic microvascular disease and age related atrophic change.   Electronically Signed   By: Elberta Fortisaniel  Boyle M.D.   On: 07/07/2013 20:21   Dg Chest Portable 1 View  07/07/2013   CLINICAL DATA:  Abnormal EKG.  Back pain  EXAM: PORTABLE CHEST - 1 VIEW  COMPARISON:  12/08/2012  FINDINGS: Chronic lung disease with diffuse interstitial fibrosis. Improved lung volume compared with the prior study. No focal infiltrate or effusion is  identified.  IMPRESSION: Chronic lung disease with interstitial fibrosis. No superimposed acute abnormality.   Electronically Signed   By: Marlan Palauharles  Clark M.D.   On: 07/07/2013 14:04   Medications: I have reviewed the patient's current medications. Scheduled Meds: . heparin  5,000 Units Subcutaneous Q8H  . sodium chloride  3 mL Intravenous Q12H  . tiotropium  18 mcg Inhalation Daily   Continuous Infusions: . sodium chloride 100 mL/hr at 07/09/13 1237   PRN Meds:.acetaminophen, acetaminophen, albuterol, ondansetron (ZOFRAN) IV, ondansetron  Assessment/Plan: Syncope: Patient has had no more episodes of syncope. No events on telemetry except for some scattered PVCs. Echo showed normal EF without AS. Given she has hx of prior CAD and MI with known LBBB, we are concerned primarily for a cardiac arrhythmia as the cause of her prior syncope. Patient remains orthostatic despite receiving IVF on night of admission, which may also have been the cause of her syncope. She did endorse having some lightheadedness when she stood up from seated position. Will give NS at 100cc/hr x 5 hours today and recheck orthostatics. Some of her medications can also cause orthostatic hypotension, though these have been held since she was admitted.  -cardiology consulted, recommend to r/o arrhythmia with continued telemetry during her hospital stay w/ set up of outpatient cardiac monitor; will speak to cardiology to set this up -IVF: NS at 100cc/hr x 5hrs -recheck orthostatic vital signs in AM prior to d/c - telemetry  - ASA  - PT/OT both recommend no further PT/OT follow up -assess medication list and make changes as needed -of note, daughter Olegario Messier(Kathy) will need a note excusing her from work during patient's hospitalization  Diarrhea: C diff negative. No more diarrhea since yesterday. -Strict I/Os   CAD w/ prior MI: Patient had MI 2 years ago according to family. Patient is on statin. Continue home statin and ASA. See  above plan for syncope.   CKD: Unknown baseline Cr. Cr 1.45 on admission and decreased to 1.34 on 1/30. Did not recheck labs today.   Vascular Dementia: CT head negative for acute process. Patient is at baseline mental status per family.  Osteoporosis: Holding home alendronate. May restart as outpatient.   Code Status: DNI  Dispo: Disposition is deferred at this time, awaiting improvement of current medical problems.   The patient does have a current PCP (Ailene RavelMaura L Hamrick, MD) and [facility] after discharge.  The patient does not have transportation limitations that hinder transportation to clinic appointments.  .Services Needed at time of discharge: Y = Yes, Blank = No PT:   OT:   RN:   Equipment:   Other:     LOS: 2 days   Windell Hummingbirdachel Americo Vallery, MD 07/09/2013, 1:03 PM

## 2013-07-09 NOTE — Evaluation (Signed)
Occupational Therapy Evaluation Patient Details Name: Kathleen Graham MRN: [id number] DOB: 1932/05/09 Today's Date: 07/09/2013 Time: [id number] OT Time Calculation (min): 38 min  OT Assessment / Plan / Recommendation History of present illness Adm s/p syncopal episode in shower (after having BM). ? cardiac related, although was orthostatic on admission and given IV fluids with good results PMHx includes severe dementia   Clinical Impression   Pt. Appears to be at baseline performance for ADLs and is limited by cognition.  Pt. Is able to follow 1 step directions with occasional visual cues needed. Pt. Was able to maintain sitting balance EOB during ADL activity. Per family report in PT eval pt. Appears baseline for mobility.     OT Assessment  Patient does not need any further OT services    Follow Up Recommendations  No OT follow up    Barriers to Discharge      Equipment Recommendations  None recommended by OT    Recommendations for Other Services    Frequency       Precautions / Restrictions Precautions Precautions: Fall   Pertinent Vitals/Pain No c/o    ADL  Eating/Feeding: Simulated;Set up Where Assessed - Eating/Feeding: Edge of bed Grooming: Performed;Wash/dry hands;Wash/dry face;Set up Where Assessed - Grooming: Unsupported sitting Upper Body Bathing: Simulated;Set up Where Assessed - Upper Body Bathing: Unsupported sitting Lower Body Bathing: Simulated;Set up Where Assessed - Lower Body Bathing: Unsupported sitting;Supported standing Upper Body Dressing: Performed;Minimal assistance Where Assessed - Upper Body Dressing: Unsupported sitting Lower Body Dressing: Performed;Minimal assistance Where Assessed - Lower Body Dressing: Unsupported sitting;Supported standing Toilet Transfer: Simulated;Minimal assistance Toilet Transfer Method: Stand pivot ADL Comments:  (Pt. appears to be at baseline )    OT Diagnosis:    OT Problem List:   OT Treatment Interventions:      OT Goals(Current goals can be found in the care plan section) Acute Rehab OT Goals Patient Stated Goal:  (go home)  Visit Information  Last OT Received On: 07/09/13 Assistance Needed: +1 History of Present Illness: Adm s/p syncopal episode in shower (after having BM). ? cardiac related, although was orthostatic on admission and given IV fluids with good results PMHx includes severe dementia       Prior Functioning     Home Living Family/patient expects to be discharged to:: Private residence Living Arrangements: Children Available Help at Discharge: Family;Available 24 hours/day Type of Home: House Home Access: Stairs to enter [employer] CorporationEntrance Stairs-Number of Steps: 2 Home Layout: One level Home Equipment: Walker - 2 wheels;Bedside commode Additional Comments:  (unable to obtain information on bathroom layout ) Prior Function Level of Independence: Needs assistance Gait / Transfers Assistance Needed: with RW with supervision to min assist ADL's / Homemaking Assistance Needed:  (assum needed A. family not available ) Communication Communication: No difficulties         Vision/Perception Vision - History Baseline Vision: No visual deficits   Cognition  Cognition Arousal/Alertness: Awake/alert Behavior During Therapy: WFL for tasks assessed/performed Overall Cognitive Status: History of cognitive impairments - at baseline    Extremity/Trunk Assessment Upper Extremity Assessment Upper Extremity Assessment: Overall WFL for tasks assessed     Mobility Bed Mobility Sidelying to sit: Supervision     Exercise     Balance     End of Session OT - End of Session Activity Tolerance: Patient tolerated treatment well Patient left: in bed;with call bell/phone within reach;with bed alarm set;with family/visitor present  GO     Javid Kemler 07/09/2013, 8:26 AM

## 2013-07-09 NOTE — Progress Notes (Signed)
Internal Medicine Attending  Date: 07/09/2013  Patient name: Kathleen Graham Medical record number: [id number] Date of birth: June 25, 1931 Age: 80 y.o. Gender: female  I saw and evaluated the patient, and discussed her care on A.M rounds with housestaff.  I reviewed the resident's note by Dr. Darci Needlehikowski and I agree with the resident's findings and plans as documented in her note.  Dr. Criselda PeachesMullen will cover as attending physician on our service this weekend, and Dr. Josem KaufmannKlima will take over as attending physician on Monday 07/12/2013.

## 2013-07-10 DIAGNOSIS — I252 Old myocardial infarction: Secondary | ICD-10-CM

## 2013-07-10 DIAGNOSIS — I447 Left bundle-branch block, unspecified: Secondary | ICD-10-CM

## 2013-07-10 DIAGNOSIS — I951 Orthostatic hypotension: Secondary | ICD-10-CM

## 2013-07-10 MED ORDER — ACETAMINOPHEN 500 MG PO TABS: 1000.0000 mg | ORAL_TABLET | Freq: Three times a day (TID) | ORAL | Status: AC | PRN

## 2013-07-10 NOTE — Progress Notes (Signed)
Patient Name: Kathleen Graham Date of Encounter: 07/10/2013  Principal Problem:   Syncope Active Problems:   Memory deficits   Abnormality of gait   COPD (chronic obstructive pulmonary disease)   Other and unspecified hyperlipidemia   History of myocardial infarct at age greater than 60 years   Length of Stay: 3  SUBJECTIVE  Comfortable, slightly disoriented (apparently chronic). No complaints.  CURRENT MEDS . heparin  5,000 Units Subcutaneous Q8H  . sodium chloride  3 mL Intravenous Q12H  . tiotropium  18 mcg Inhalation Daily    OBJECTIVE  Filed Vitals:   07/10/13 0441 07/10/13 0757 07/10/13 0758 07/10/13 0759  BP: 124/68 122/62 99/69 118/63  Pulse: 81 76 85 99  Temp: 99.9 F (37.7 C) 98.1 F (36.7 C)    TempSrc: Oral Oral    Resp: 19 18 18 18   Height:      Weight:      SpO2: 96% 96% 96% 97%    Intake/Output Summary (Last 24 hours) at 07/10/13 1100 Last data filed at 07/09/13 1300  Gross per 24 hour  Intake    120 ml  Output      0 ml  Net    120 ml   Filed Weights   07/07/13 2330  Weight: 67.586 kg (149 lb)    PHYSICAL EXAM  General: Pleasant, NAD. Neuro: Alert and oriented X 3. Moves all extremities spontaneously. Psych: Normal affect. HEENT:  Normal  Neck: Supple without bruits or JVD. Lungs:  Resp regular and unlabored, CTA. Heart: RRR no s3, s4, or murmurs. Abdomen: Soft, non-tender, non-distended, BS + x 4.  Extremities: No clubbing, cyanosis or edema. DP/PT/Radials 2+ and equal bilaterally.  Accessory Clinical Findings  CBC  Recent Labs  07/07/13 1353 07/08/13 0313  WBC 11.8* 9.1  NEUTROABS 8.1*  --   HGB 12.4 11.2*  HCT 36.5 33.7*  MCV 90.6 90.6  PLT 153 140*   Basic Metabolic Panel  Recent Labs  07/07/13 1353 07/08/13 0313  NA 139 142  K 4.3 3.8  CL 101 105  CO2 26 24  GLUCOSE 104* 80  BUN 11 12  CREATININE 1.45* 1.34*  CALCIUM 9.0 8.7   Liver Function Tests  Recent Labs  07/07/13 1353  AST 22  ALT 15   ALKPHOS 137*  BILITOT 0.4  PROT 7.4  ALBUMIN 3.2*   No results found for this basename: LIPASE, AMYLASE,  in the last 72 hours Cardiac Enzymes  Recent Labs  07/07/13 2104 07/08/13 1115  TROPONINI <0.30 <0.30   BNP No components found with this basename: POCBNP,  D-Dimer  Recent Labs  07/07/13 2222  DDIMER 4.44*   Hemoglobin A1C No results found for this basename: HGBA1C,  in the last 72 hours Fasting Lipid Panel No results found for this basename: CHOL, HDL, LDLCALC, TRIG, CHOLHDL, LDLDIRECT,  in the last 72 hours Thyroid Function Tests No results found for this basename: TSH, T4TOTAL, FREET3, T3FREE, THYROIDAB,  in the last 72 hours  Radiology/Studies  Ct Head Wo Contrast  07/07/2013   CLINICAL DATA:  Syncope.  EXAM: CT HEAD WITHOUT CONTRAST  TECHNIQUE: Contiguous axial images were obtained from the base of the skull through the vertex without intravenous contrast.  COMPARISON:  11/24/2012 and 02/24/2006  FINDINGS: Ventricles, cisterns and other CSF spaces are within normal. There is mild chronic ischemic microvascular disease and mild age related atrophic change. There is no focal mass, mass effect, shift of midline structures or acute hemorrhage. There is no  evidence to suggest acute infarction. Remaining bones and soft tissues are within normal.  IMPRESSION: No acute intracranial findings.  Mild chronic ischemic microvascular disease and age related atrophic change.   Electronically Signed   By: Elberta Fortisaniel  Boyle M.D.   On: 07/07/2013 20:21   Dg Chest Portable 1 View  07/07/2013   CLINICAL DATA:  Abnormal EKG.  Back pain  EXAM: PORTABLE CHEST - 1 VIEW  COMPARISON:  12/08/2012  FINDINGS: Chronic lung disease with diffuse interstitial fibrosis. Improved lung volume compared with the prior study. No focal infiltrate or effusion is identified.  IMPRESSION: Chronic lung disease with interstitial fibrosis. No superimposed acute abnormality.   Electronically Signed   By: Marlan Palauharles  Clark  M.D.   On: 07/07/2013 14:04    TELE NSR, occasional PVCs, no pauses, no VT  ECG NSR, LBBB    ASSESSMENT AND PLAN Syncope was most likely due to hypotension secondary to combination of hypovolemia/diarrhea, vasodilatation post bath +/- vagal response (note nausea/vomiting). The presence of normal LV function and absence of major structural abnormalities makes VT/VF less likely.  LBBB does raise the concern for possible high grade AV block and it is reasonable to order a 30 day event monitor at discharge. Please let us know when DC is anticipated so we can arrange monitor and appropriate f/u.  DNR status noted.   Thurmon FairMihai Daquann Merriott, MD, [facility] [phone] office [phone] pager 07/10/2013 11:00 AM

## 2013-07-10 NOTE — Progress Notes (Signed)
Discharged to home with family office visits in place teaching done  

## 2013-07-10 NOTE — Progress Notes (Signed)
Utilization Review Completed.   Inette Doubrava, RN, BSN Nurse Case Manager  

## 2013-07-10 NOTE — Progress Notes (Cosign Needed)
  Progress Note   Subjective:  Kathleen Graham is a 80 y.o. female who was admitted after a syncopal episode with loss of pulse and respiratory arrest in setting of severe pain.  Episode assoc with fecal incontinence, nausea/vomiting and hypotension.  Normal RV and LV fxn on Echo and normal CEs.  DDimer elevated but no RV dysfunction on echo and low risk for PE.   Denies CP or dyspnea.  She has been coughing up some clear to yellowish sputum.   Objective:  Filed Vitals:   07/10/13 0441 07/10/13 0757 07/10/13 0758 07/10/13 0759  BP: 124/68 122/62 99/69 118/63  Pulse: 81 76 85 99  Temp: 99.9 F (37.7 C) 98.1 F (36.7 C)    TempSrc: Oral Oral    Resp: 19 18 18 18   Height:      Weight:      SpO2: 96% 96% 96% 97%    Intake/Output from previous day:  Intake/Output Summary (Last 24 hours) at 07/10/13 0920 Last data filed at 07/09/13 1300  Gross per 24 hour  Intake    120 ml  Output      0 ml  Net    120 ml    PHYSICAL EXAM: No acute distress Neck: no JVD Cardiac:  normal S1, S2; RRR; no murmur Lungs:  clear to auscultation bilaterally, no wheezing, rhonchi or rales Abd: soft, nontender, no hepatomegaly Ext: no edema Skin: warm and dry Neuro:  CNs 2-12 intact, no focal abnormalities noted   Tele:  NSR  Lab Results:  Basic Metabolic Panel:  Recent Labs  16/03/9600/28/15 1353 07/08/13 0313  NA 139 142  K 4.3 3.8  CL 101 105  CO2 26 24  GLUCOSE 104* 80  BUN 11 12  CREATININE 1.45* 1.34*  CALCIUM 9.0 8.7    CBC:  Recent Labs  07/07/13 1353 07/08/13 0313  WBC 11.8* 9.1  NEUTROABS 8.1*  --   HGB 12.4 11.2*  HCT 36.5 33.7*  MCV 90.6 90.6  PLT 153 140*    Cardiac Enzymes:  Recent Labs  07/07/13 2104 07/08/13 1115  TROPONINI <0.30 <0.30    ASSESSMENT:  1. Recurrent syncope/cardiorespiratory arrest  2. Dementia  3. H/o MI  4. LBBB  5. PVCs  6. Cough  PLAN/DISCUSSION: No arrhythmias on monitor.  She did have some orthostatic intolerance this AM with  BP drop from lying to sitting but returned to baseline upon standing.  So, suspect vagal episode still most likely culprit for syncope.  Consider event monitor at d/c if patient and family agreeable.  Consider LE compression stockings.  With low grade temp and cough, may need to repeat CXR.  Defer to primary team. We will continue to follow.    Tereso NewcomerScott Celeste Tavenner, PA-C   07/10/2013 9:20 AM  Pager # [phone]

## 2013-07-10 NOTE — Progress Notes (Signed)
Subjective: Patient has no complaints today. Nursing rechecked orthostatics this morning and patient is no longer orthostaticorthostatic. No arrhythmias on monitor overnight. She denies any chest pain or SOB.  Objective: Vital signs in last 24 hours: Filed Vitals:   07/10/13 0441 07/10/13 0757 07/10/13 0758 07/10/13 0759  BP: 124/68 122/62 99/69 118/63  Pulse: 81 76 85 99  Temp: 99.9 F (37.7 C) 98.1 F (36.7 C)    TempSrc: Oral Oral    Resp: 19 18 18 18   Height:      Weight:      SpO2: 96% 96% 96% 97%  Lying BP 137/54  HR 87 Standing after two minutes: BP 115/59   HR 104  Weight change:   Intake/Output Summary (Last 24 hours) at 07/10/13 1012 Last data filed at 07/09/13 1300  Gross per 24 hour  Intake    120 ml  Output      0 ml  Net    120 ml   Physical Exam General: alert, cooperative, NAD HEENT: NCAT, PERRL, EOMI Lungs: clear to ascultation bilaterally, normal work of respiration, no wheezes, rales, ronchi Heart: regular rate and rhythm, no murmurs, gallops, or rubs Abdomen: soft, non-tender, non-distended Extremities: warm extremities; no BLE edema. Moves all 4 extremities. Neurologic: Alert & oriented to person and place. No focal neurological deficits  Lab Results: Basic Metabolic Panel:  Recent Labs Lab 07/07/13 1353 07/08/13 0313  NA 139 142  K 4.3 3.8  CL 101 105  CO2 26 24  GLUCOSE 104* 80  BUN 11 12  CREATININE 1.45* 1.34*  CALCIUM 9.0 8.7   Liver Function Tests:  Recent Labs Lab 07/07/13 1353  AST 22  ALT 15  ALKPHOS 137*  BILITOT 0.4  PROT 7.4  ALBUMIN 3.2*   CBC:  Recent Labs Lab 07/07/13 1353 07/08/13 0313  WBC 11.8* 9.1  NEUTROABS 8.1*  --   HGB 12.4 11.2*  HCT 36.5 33.7*  MCV 90.6 90.6  PLT 153 140*   Cardiac Enzymes:  Recent Labs Lab 07/07/13 2104 07/08/13 1115  TROPONINI <0.30 <0.30   D-Dimer:  Recent Labs Lab 07/07/13 2222  DDIMER 4.44*   Urinalysis:  Recent Labs Lab 07/07/13 2038  COLORURINE  YELLOW  LABSPEC 1.016  PHURINE 7.0  GLUCOSEU NEGATIVE  HGBUR NEGATIVE  BILIRUBINUR NEGATIVE  KETONESUR NEGATIVE  PROTEINUR NEGATIVE  UROBILINOGEN 1.0  NITRITE NEGATIVE  LEUKOCYTESUR NEGATIVE    Micro Results: Recent Results (from the past 240 hour(s))  CLOSTRIDIUM DIFFICILE BY PCR     Status: None   Collection Time    07/08/13  1:07 PM      Result Value Range Status   C difficile by pcr NEGATIVE  NEGATIVE Final   Studies/Results: No results found. Medications: I have reviewed the patient's current medications. Scheduled Meds: . heparin  5,000 Units Subcutaneous Q8H  . sodium chloride  3 mL Intravenous Q12H  . tiotropium  18 mcg Inhalation Daily   Continuous Infusions:   PRN Meds:.acetaminophen, acetaminophen, albuterol, ondansetron (ZOFRAN) IV, ondansetron  Assessment/Plan: Syncope: Patient w/o further episodes of syncope. No events on telemetry except for some scattered PVCs. Echo w/ normal EF without AS. Given she has hx of prior CAD and MI with known LBBB, possible cardiac arrhythmia was the cause of her prior syncope. Patient orthostatic on admission, which has resolved with IVF, which may also have been the cause of her syncope. Some of her medications can also cause orthostatic hypotension, though these have been held since she  was admitted. Cardiology consulted, recommended outpatient cardiac monitor if family wants this - F/u with Cards, appreciate recommendations - Continue telemetry  - ASA  - PT/OT both recommend no further PT/OT follow up - assess medication list and make changes as needed - of note, daughter Olegario Messier) will need a note excusing her from work during patient's hospitalization  Diarrhea: C diff negative. No more diarrhea since 1/29. -Strict I/Os   CAD w/ prior MI: Patient had MI 2 years ago according to family. Patient is on statin. Continue home statin and ASA. See above plan for syncope.   CKD: Unknown baseline Cr. Cr 1.45 on admission and  decreased to 1.34 on 1/29.   Vascular Dementia: CT head negative for acute process. Patient is at baseline mental status per family.  Osteoporosis: Holding home alendronate. May restart as outpatient.   Code Status: DNI  Dispo: Possible d/c to home today with [facility] PT/OT  The patient does have a current PCP (Ailene Ravel, MD) and does not need an [facility] hospital follow-up appointment after discharge.  The patient does not have transportation limitations that hinder transportation to clinic appointments.  .Services Needed at time of discharge: Y = Yes, Blank = No PT:   OT:   RN:   Equipment:   Other:     LOS: 3 days   Genelle Gather, MD 07/10/2013, 10:12 AM

## 2013-07-11 NOTE — Discharge Summary (Signed)
I saw Kathleen Graham on day of discharge and assisted in the discharge planning.

## 2013-07-12 NOTE — Progress Notes (Signed)
OT eval addendum: Late entry for G-codes.  07/09/13 0800  OT Time Calculation  OT Start Time 0750  OT Stop Time 0828  OT Time Calculation (min) 38 min  OT G-codes **NOT FOR INPATIENT CLASS**  Functional Assessment Tool Used (clinical judgement)  Functional Limitation Self care  Self Care Current Status [phone](G8987) CI  Self Care Goal Status (U0454(G8988) CI  Self Care Discharge Status ([id number](G8989) CI  OT General Charges  $OT Visit 1 Procedure  OT Evaluation  $Initial OT Evaluation Tier I 1 Procedure  OT Treatments  $Self Care/Home Management  23-37 mins

## 2013-07-22 ENCOUNTER — Encounter (INDEPENDENT_AMBULATORY_CARE_PROVIDER_SITE_OTHER): Payer: Medicare Other

## 2013-07-22 ENCOUNTER — Encounter: Payer: Self-pay | Admitting: *Deleted

## 2013-07-22 DIAGNOSIS — R55 Syncope and collapse: Secondary | ICD-10-CM

## 2013-07-22 NOTE — Progress Notes (Signed)
Patient ID: Kathleen Graham, female   DOB: 09/24/1931, 80 y.o.   MRN: [id number] E-Cardio verite 30 day cardiac event monitor applied to patient.

## 2013-09-03 ENCOUNTER — Telehealth: Payer: Self-pay | Admitting: *Deleted

## 2013-09-03 NOTE — Telephone Encounter (Signed)
PT'S  DAUGHTER  AWARE OF MONITOR RESULTS PER  DR NISAHN  SR  PVC  NO  SIG  ARRHYTHMIA./CY

## 2013-10-11 ENCOUNTER — Emergency Department: Payer: Self-pay | Admitting: Emergency Medicine

## 2013-10-12 LAB — BASIC METABOLIC PANEL
Anion Gap: 6 — ABNORMAL LOW (ref 7–16)
BUN: 17 mg/dL (ref 7–18)
CALCIUM: 8.4 mg/dL — AB (ref 8.5–10.1)
CO2: 27 mmol/L (ref 21–32)
CREATININE: 1.62 mg/dL — AB (ref 0.60–1.30)
Chloride: 106 mmol/L (ref 98–107)
EGFR (African American): 34 — ABNORMAL LOW
EGFR (Non-African Amer.): 30 — ABNORMAL LOW
Glucose: 102 mg/dL — ABNORMAL HIGH (ref 65–99)
Osmolality: 279 (ref 275–301)
Potassium: 3.8 mmol/L (ref 3.5–5.1)
SODIUM: 139 mmol/L (ref 136–145)

## 2013-10-12 LAB — CBC WITH DIFFERENTIAL/PLATELET
Basophil #: 0.1 10*3/uL (ref 0.0–0.1)
Basophil %: 0.8 %
Eosinophil #: 0.6 10*3/uL (ref 0.0–0.7)
Eosinophil %: 5 %
HCT: 34.4 % — ABNORMAL LOW (ref 35.0–47.0)
HGB: 11.2 g/dL — AB (ref 12.0–16.0)
LYMPHS PCT: 22 %
Lymphocyte #: 2.6 10*3/uL (ref 1.0–3.6)
MCH: 30.4 pg (ref 26.0–34.0)
MCHC: 32.5 g/dL (ref 32.0–36.0)
MCV: 94 fL (ref 80–100)
MONOS PCT: 6.9 %
Monocyte #: 0.8 x10 3/mm (ref 0.2–0.9)
NEUTROS ABS: 7.8 10*3/uL — AB (ref 1.4–6.5)
NEUTROS PCT: 65.3 %
Platelet: 154 10*3/uL (ref 150–440)
RBC: 3.67 10*6/uL — ABNORMAL LOW (ref 3.80–5.20)
RDW: 13.7 % (ref 11.5–14.5)
WBC: 11.9 10*3/uL — ABNORMAL HIGH (ref 3.6–11.0)

## 2013-10-12 LAB — URINALYSIS, COMPLETE
Bilirubin,UR: NEGATIVE
Glucose,UR: NEGATIVE mg/dL (ref 0–75)
Hyaline Cast: 4
KETONE: NEGATIVE
Nitrite: NEGATIVE
Ph: 5 (ref 4.5–8.0)
Protein: 30
RBC,UR: 4 /HPF (ref 0–5)
SPECIFIC GRAVITY: 1.026 (ref 1.003–1.030)

## 2013-10-12 LAB — TROPONIN I: Troponin-I: <0.02 ng/mL

## 2014-04-17 ENCOUNTER — Inpatient Hospital Stay: Payer: Self-pay | Admitting: Internal Medicine

## 2014-04-17 LAB — COMPREHENSIVE METABOLIC PANEL
Albumin: 3.1 g/dL — ABNORMAL LOW (ref 3.4–5.0)
Alkaline Phosphatase: 144 U/L — ABNORMAL HIGH
Anion Gap: 8 (ref 7–16)
BUN: 17 mg/dL (ref 7–18)
Bilirubin,Total: 0.7 mg/dL (ref 0.2–1.0)
CALCIUM: 8.3 mg/dL — AB (ref 8.5–10.1)
CREATININE: 1.69 mg/dL — AB (ref 0.60–1.30)
Chloride: 105 mmol/L (ref 98–107)
Co2: 26 mmol/L (ref 21–32)
EGFR (African American): 38 — ABNORMAL LOW
EGFR (Non-African Amer.): 31 — ABNORMAL LOW
Glucose: 189 mg/dL — ABNORMAL HIGH (ref 65–99)
Osmolality: 284 (ref 275–301)
Potassium: 3.8 mmol/L (ref 3.5–5.1)
SGOT(AST): 15 U/L (ref 15–37)
SGPT (ALT): 15 U/L
SODIUM: 139 mmol/L (ref 136–145)
Total Protein: 7.3 g/dL (ref 6.4–8.2)

## 2014-04-17 LAB — URINALYSIS, COMPLETE
Bilirubin,UR: NEGATIVE
GLUCOSE, UR: NEGATIVE mg/dL (ref 0–75)
Ketone: NEGATIVE
Nitrite: POSITIVE
Ph: 5 (ref 4.5–8.0)
Specific Gravity: 1.019 (ref 1.003–1.030)
WBC UR: 3595 /HPF (ref 0–5)

## 2014-04-17 LAB — CBC WITH DIFFERENTIAL/PLATELET
BASOS PCT: 0.6 %
Basophil #: 0.1 10*3/uL (ref 0.0–0.1)
Eosinophil #: 0.3 10*3/uL (ref 0.0–0.7)
Eosinophil %: 1.7 %
HCT: 39.3 % (ref 35.0–47.0)
HGB: 13 g/dL (ref 12.0–16.0)
Lymphocyte #: 2.3 10*3/uL (ref 1.0–3.6)
Lymphocyte %: 15.4 %
MCH: 31 pg (ref 26.0–34.0)
MCHC: 33.1 g/dL (ref 32.0–36.0)
MCV: 94 fL (ref 80–100)
Monocyte #: 0.8 x10 3/mm (ref 0.2–0.9)
Monocyte %: 5.5 %
NEUTROS ABS: 11.5 10*3/uL — AB (ref 1.4–6.5)
Neutrophil %: 76.8 %
PLATELETS: 185 10*3/uL (ref 150–440)
RBC: 4.2 10*6/uL (ref 3.80–5.20)
RDW: 13.8 % (ref 11.5–14.5)
WBC: 15 10*3/uL — AB (ref 3.6–11.0)

## 2014-04-17 LAB — MAGNESIUM: MAGNESIUM: 1.7 mg/dL — AB

## 2014-04-17 LAB — PROTIME-INR
INR: 1.1
PROTHROMBIN TIME: 13.8 s (ref 11.5–14.7)

## 2014-04-17 LAB — LIPASE, BLOOD: Lipase: 392 U/L (ref 73–393)

## 2014-04-17 LAB — TROPONIN I: Troponin-I: <0.02 ng/mL

## 2014-04-18 LAB — BASIC METABOLIC PANEL
ANION GAP: 7 (ref 7–16)
BUN: 13 mg/dL (ref 7–18)
CALCIUM: 8.2 mg/dL — AB (ref 8.5–10.1)
CO2: 28 mmol/L (ref 21–32)
CREATININE: 1.36 mg/dL — AB (ref 0.60–1.30)
Chloride: 108 mmol/L — ABNORMAL HIGH (ref 98–107)
EGFR (African American): 48 — ABNORMAL LOW
GFR CALC NON AF AMER: 40 — AB
GLUCOSE: 77 mg/dL (ref 65–99)
Osmolality: 284 (ref 275–301)
Potassium: 3.6 mmol/L (ref 3.5–5.1)
Sodium: 143 mmol/L (ref 136–145)

## 2014-04-18 LAB — CBC WITH DIFFERENTIAL/PLATELET
BASOS ABS: 0 10*3/uL (ref 0.0–0.1)
BASOS PCT: 0.4 %
Eosinophil #: 0.3 10*3/uL (ref 0.0–0.7)
Eosinophil %: 2.6 %
HCT: 35.8 % (ref 35.0–47.0)
HGB: 11.8 g/dL — ABNORMAL LOW (ref 12.0–16.0)
LYMPHS ABS: 3.4 10*3/uL (ref 1.0–3.6)
Lymphocyte %: 31.9 %
MCH: 31.1 pg (ref 26.0–34.0)
MCHC: 32.9 g/dL (ref 32.0–36.0)
MCV: 95 fL (ref 80–100)
Monocyte #: 0.7 x10 3/mm (ref 0.2–0.9)
Monocyte %: 6.5 %
NEUTROS ABS: 6.2 10*3/uL (ref 1.4–6.5)
NEUTROS PCT: 58.6 %
Platelet: 164 10*3/uL (ref 150–440)
RBC: 3.79 10*6/uL — ABNORMAL LOW (ref 3.80–5.20)
RDW: 13.7 % (ref 11.5–14.5)
WBC: 10.6 10*3/uL (ref 3.6–11.0)

## 2014-04-19 LAB — CBC WITH DIFFERENTIAL/PLATELET
Basophil #: 0.1 10*3/uL (ref 0.0–0.1)
Basophil %: 0.8 %
EOS PCT: 4 %
Eosinophil #: 0.3 10*3/uL (ref 0.0–0.7)
HCT: 38.5 % (ref 35.0–47.0)
HGB: 12.7 g/dL (ref 12.0–16.0)
LYMPHS ABS: 2.7 10*3/uL (ref 1.0–3.6)
LYMPHS PCT: 31.2 %
MCH: 31.1 pg (ref 26.0–34.0)
MCHC: 33 g/dL (ref 32.0–36.0)
MCV: 94 fL (ref 80–100)
MONO ABS: 0.5 x10 3/mm (ref 0.2–0.9)
Monocyte %: 5.4 %
NEUTROS PCT: 58.6 %
Neutrophil #: 5.1 10*3/uL (ref 1.4–6.5)
Platelet: 185 10*3/uL (ref 150–440)
RBC: 4.07 10*6/uL (ref 3.80–5.20)
RDW: 13.9 % (ref 11.5–14.5)
WBC: 8.7 10*3/uL (ref 3.6–11.0)

## 2014-04-19 LAB — BASIC METABOLIC PANEL
ANION GAP: 8 (ref 7–16)
BUN: 11 mg/dL (ref 7–18)
CO2: 27 mmol/L (ref 21–32)
CREATININE: 1.53 mg/dL — AB (ref 0.60–1.30)
Calcium, Total: 8.3 mg/dL — ABNORMAL LOW (ref 8.5–10.1)
Chloride: 110 mmol/L — ABNORMAL HIGH (ref 98–107)
GFR CALC AF AMER: 42 — AB
GFR CALC NON AF AMER: 35 — AB
GLUCOSE: 144 mg/dL — AB (ref 65–99)
OSMOLALITY: 291 (ref 275–301)
Potassium: 3.7 mmol/L (ref 3.5–5.1)
Sodium: 145 mmol/L (ref 136–145)

## 2014-04-20 LAB — URINE CULTURE

## 2014-04-22 LAB — CULTURE, BLOOD (SINGLE)

## 2014-09-11 ENCOUNTER — Emergency Department: Admit: 2014-09-11 | Disposition: A | Discharge status: Home / Self Care | Payer: Self-pay | Admitting: Internal Medicine

## 2014-09-11 LAB — PROTIME-INR
INR: 1.1
Prothrombin Time: 14 secs

## 2014-09-11 LAB — URINALYSIS, COMPLETE
Bilirubin,UR: NEGATIVE
Blood: NEGATIVE
GLUCOSE, UR: NEGATIVE mg/dL (ref 0–75)
Ketone: NEGATIVE
NITRITE: POSITIVE
Ph: 6 (ref 4.5–8.0)
Protein: 30
RBC,UR: <1 /HPF (ref 0–5)
Specific Gravity: 1.014 (ref 1.003–1.030)
Squamous Epithelial: 1
WBC UR: 12 /HPF (ref 0–5)

## 2014-09-11 LAB — COMPREHENSIVE METABOLIC PANEL
ALK PHOS: 133 U/L — AB
ALT: 17 U/L
ANION GAP: 12 (ref 7–16)
Albumin: 3.6 g/dL
BUN: 16 mg/dL
Bilirubin,Total: 0.8 mg/dL
CHLORIDE: 102 mmol/L
CO2: 25 mmol/L
Calcium, Total: 9.4 mg/dL
Creatinine: 1.42 mg/dL — ABNORMAL HIGH
EGFR (African American): 40 — ABNORMAL LOW
EGFR (Non-African Amer.): 35 — ABNORMAL LOW
GLUCOSE: 140 mg/dL — AB
POTASSIUM: 4.5 mmol/L
SGOT(AST): 31 U/L
Sodium: 139 mmol/L
Total Protein: 8.2 g/dL — ABNORMAL HIGH

## 2014-09-11 LAB — CBC
HCT: 41.1 % (ref 35.0–47.0)
HGB: 13.4 g/dL (ref 12.0–16.0)
MCH: 31.7 pg (ref 26.0–34.0)
MCHC: 32.6 g/dL (ref 32.0–36.0)
MCV: 97 fL (ref 80–100)
Platelet: 213 10*3/uL (ref 150–440)
RBC: 4.23 10*6/uL (ref 3.80–5.20)
RDW: 14.1 % (ref 11.5–14.5)
WBC: 15.6 10*3/uL — AB (ref 3.6–11.0)

## 2014-09-11 LAB — TROPONIN I: TROPONIN-I: 0.03 ng/mL

## 2014-09-11 LAB — PRO B NATRIURETIC PEPTIDE: B-Type Natriuretic Peptide: 531 pg/mL — ABNORMAL HIGH

## 2014-09-16 LAB — CULTURE, BLOOD (SINGLE)

## 2014-10-01 NOTE — H&P (Signed)
PATIENT NAME:  Graham, Kathleen MR#:  [id number] DATE OF BIRTH:  [birth year]  PRIMARY CARE PHYSICIAN:  Maura L. Hamrick, [place] in [place]  CHIEF COMPLAINT: Passing out spell.  History is obtained from the patient, patient's daughter-in-law and daughter, who is present in the Emergency Room.   HISTORY OF PRESENT ILLNESS: Kathleen Graham is an 81 year old Caucasian female with past medical history of hypertension, hyperlipidemia, and dementia, who is brought in from home after she was found to pass out, became cold, clammy in the afternoon while she was sitting on her chair at home getting her nails done. The patient had been having some cough, no fever, and also noted to have foul-smelling urine. In the Emergency Room, she was noted to have left lower lobe pneumonia along with UTI, which is recurrent. She received a dose of IV antibiotics.  She is being admitted for further evaluation and management.   PAST MEDICAL HISTORY: 1.  Hypertension.  2.  Dementia.  3.  Hyperlipidemia.  4.  Asthma and COPD.  ALLERGIES: No known drug allergies.   FAMILY HISTORY: Positive for hypertension.   SOCIAL HISTORY: She lives at home with her son and daughter-in-law. Walks around the house using a walker. No alcohol. Former smoker.   MEDICATIONS: 1.  Aspirin 81 mg 2 tablets daily.  2.  Atorvastatin 40 mg daily.  3.  Carvedilol 3.125 mg b.i.d.  4.  Citalopram 20 mg daily.  5.  Donepezil 10 mg daily.  6.  DuoNeb 3 mL q. 4 hourly as needed.  7.  Memantine 10 mg b.i.d.  8.  Multivitamin daily.  9.  Nitroglycerin sublingual as needed.  10.  Spiriva 18 mcg inhalation daily.  11.  Vitamin B12, 1000 mcg p.o. daily.   PAST SURGICAL HISTORY: Cataract surgery.   REVIEW OF SYSTEMS:   CONSTITUTIONAL: No fever. Positive for fatigue, weakness.  EYES: No blurred or double vision, glaucoma, status post cataract surgery. EARS, NOSE, THROAT: No tinnitus, ear pain, hearing loss, or postnasal drip.  RESPIRATORY:  Positive for cough, no dyspnea, or hemoptysis. Positive for COPD.  CARDIOVASCULAR: No chest pain, orthopnea, edema, or hypertension.  GASTROINTESTINAL: No nausea, vomiting, diarrhea, abdominal pain.  GENITOURINARY: No dysuria, hematuria, or frequency.  ENDOCRINE: No polyuria, nocturia, or thyroid problems.  HEMATOLOGY: No anemia or easy bruising.  SKIN: No acne, rash. MUSCULOSKELETAL:  No arthritis or swelling or gout.  NEUROLOGIC: No CVA, TIA, positive for dementia.  PSYCHIATRIC: No anxiety or bipolar disorder. Positive for depression.  All other systems reviewed and negative.  PHYSICAL EXAMINATION:  GENERAL:  The patient is awake, alert, oriented x 2. VITAL SIGNS:  Afebrile. Pulse is 67, blood pressure is 146/69, saturations are 100% on 2 liters.  HEENT: Atraumatic, normocephalic. PERRLA, EOM intact. Oral mucosa is moist. The patient has lower dentures.  NECK: Supple. No JVD. No carotid bruit.  RESPIRATION: Clear to auscultation. There is decreased breath sounds in the bases. Basilar crackles are heard. No respiratory distress or labored breathing.  CARDIOVASCULAR: Both the heart sounds are normal, rate, rhythm regular. PMI not lateralized. Chest nontender.  EXTREMITIES: Good pedal pulses, good femoral pulses, 1+ pitting edema.  NEUROLOGIC: The patient is awake. She is alert and oriented x 2. Moves all her extremities well, no focal deficit. She has subjective generalized weakness.  SKIN: Warm and dry.  PSYCHIATRIC: The patient is awake, alert, oriented x 2.   IMAGING AND LABORATORY DATA: Chest x-ray shows left lower lobe pneumonia. Lactic acid is 1.9.  CT of the head shows no evidence of acute intracranial abnormality. UA positive for UTI. Chest x-ray shows increased opacity in the left lower lobe concerning for pneumonia. Stable fluid, parenchymal scarring in the periphery of the right upper lobe. Aortic atherosclerosis. White count is 15,000. H and H is 13 and 39.3. Glucose is 189, BUN  is 17, creatinine is 1.6. Albumin is 3.1. Magnesium is 1.7,  PT, INR is 13.8 and 1.1. Troponin is less than 0.02.   ASSESSMENT: An 81 year old Kathleen Graham with history of hypertension, dementia, and history of asthma and chronic obstructive pulmonary disease, comes in with:  1.  Left lower lobe pneumonia and urinary tract infection. The patient is going to be admitted in the medical floor. We will continue her on intravenous Rocephin and Zithromax.  Followup  blood cultures, urine cultures, and de-escalate antibiotics according to the cultures. We will order CBC in the morning. Tylenol as needed.  2.  Dementia. Continue Namenda and Aricept as before.  3.  History of depression, on Celexa.  4.  Hypertension, on Coreg.  5.  History of chronic obstructive pulmonary disease and asthma with history of former smoking. Continue Spiriva. The patient is not in chronic obstructive pulmonary disease exacerbation at this time. We will give her nebulizer treatment as needed.  6.  Deep vein thrombosis prophylaxis. Subcutaneous heparin t.i.d.  The above was discussed with the patient and the patient's family members. Code status was addressed. The family requests full code for now.   TIME SPENT: 50 minutes.    ____________________________ Wylie HailSona A. Allena KatzPatel, MD sap:LT D: 04/17/2014 16:39:52 ET T: 04/17/2014 17:11:16 ET JOB#: [id number]  cc: Oralia Criger A. Allena KatzPatel, MD, <Dictator> Maura L. Hamrick, MD Willow OraSONA A Zeplin Aleshire MD ELECTRONICALLY SIGNED 04/28/2014 11:53

## 2014-10-01 NOTE — Discharge Summary (Signed)
PATIENT NAME:  Kathleen Graham, Kathleen Graham MR#:  [id number] DATE OF BIRTH:  02-18-1932  DATE OF ADMISSION:  04/17/2014 DATE OF DISCHARGE:  04/19/2014.   PRESENTING COMPLAINT: Episode of syncope.   DISCHARGE DIAGNOSES:  1.  Left lower lobe pneumonia, community-acquired.  2.  Urinary tract infection.  3.  Advanced dementia.  4.  Depression.  5.  Hypertension.  6.  Chronic obstructive pulmonary disease.  7.  Generalized weakness.   CONSULTATIONS: None.   PROCEDURES PERFORMED:  1.  CT of the head, 04/17/2014 shows no acute intracranial abnormality, atrophy with secondary ventriculomegaly, small vessel ischemic changes with intracranial atherosclerosis.  2.  Chest x-ray on 04/17/2014 shows focally increased patchy airspace disease in the left lower lobe, concerning for pneumonia. Otherwise, similar pattern of predominantly peripheral interstitial prominence concerning for underlying pulmonary fibrosis or interstitial lung disease. Stable pleural effusion parenchymal scarring in the periphery of the right upper lobe. Aortic atherosclerosis.   HISTORY OF PRESENT ILLNESS: This very pleasant 81 year old female with advanced dementia, hypertension, and hyperlipidemia, is brought from home after passing out. The patient had been having cough prior to this episode and also had foul-smelling urine. In the Emergency Room, she was found to have left lower lobe pneumonia and urinary tract infection which was recurrent.   HOSPITAL COURSE BY PROBLEM:  1.  Left lower lobe pneumonia. The patient was initially treated with Rocephin and azithromycin. Blood cultures are negative x 2. At the time of discharge, her oxygen saturation is in the mid 90% on room air. There is no respiratory distress. She will continue a course of Levaquin to complete 7 days of antibiotics. She can take Mucinex for secretions.  2.  Urinary tract infection: Urinalysis on admission positive for high white blood cell count. This was not cultured,  unfortunately. She will continue on Levaquin, which would cover most organisms which would cause urinary tract infection. Urine can be rechecked in 10 days to ensure clearing.  3.  Dementia: The patient has advanced dementia without behavioral disturbance. This remained unchanged during this admission. She continues on Namenda and Aricept, as prior to admission.  4.  History of depression: No signs of uncontrolled depression or anxiety. Continue Celexa.  5.  Hypertension: Blood pressure relatively well controlled in hospital on Coreg.  6.  Chronic obstructive pulmonary disease: No signs of chronic obstructive pulmonary disease exacerbation during hospitalization. She should continue with Spiriva and nebulizer treatments.  7.  Weakness and dementia: The patient has been evaluated and approved for discharge to a skilled nursing facility.   DISCHARGE PHYSICAL EXAMINATION:  VITAL SIGNS: Temperature 98.6, heart rate 74, respirations 18, blood pressure 155/71, oxygenation 95% on room air.  GENERAL: No acute distress.  CARDIOVASCULAR: Regular rate and rhythm. No murmurs, rubs, or gallops. No peripheral edema. Peripheral pulses are 1+.  PULMONARY: There are bibasilar crackles, scattered rhonchi, no respiratory distress. Good air movement.  ABDOMEN: Soft, nontender, nondistended. Bowel sounds are normal.  NEUROLOGIC: Cranial nerves II through XII are grossly intact. Strength and sensation are intact.  PSYCHIATRIC: The patient is not oriented to place or situation; oriented to self only. No signs of uncontrolled depression or anxiety.   LABORATORY DATA: Sodium 143, potassium 3.6, chloride 108, bicarbonate 28, BUN 13, creatinine 1.36. Hemoglobin 11.8, white blood cells 10.6, platelets 164,000, MCV 95. Blood cultures negative x 2. Urinalysis positive for signs of infection.   CONDITION ON DISCHARGE: Stable.   DISPOSITION: Discharged to skilled nursing facility.   DISCHARGE MEDICATIONS:  1.  Citalopram 20  mg 1 tablet daily.  2.  Carvedilol 3.125 mg 1 tablet twice a day.  3.  Multivitamin 1 tablet daily.  4.  Nitroglycerin 0.4 mg sublingual 1 tablet every 5 minutes, up to 3 doses as needed for chest pain.  5.  Aspirin 81 mg 2 tablets daily.  6.  Vitamin B12 1 tablet once a day.  7.  Spiriva 18 mg 1 capsule via HandiHaler once a day.  8.  DuoNeb 2.5 mg/0.5 mg/3 mL inhalation solution 1 vial via nebulizer every 4 hours as needed for shortness of breath or wheezing.  9.  Memantine 10 mg 1 tablet twice a day.  10.  Donepezil 10 mg 1 tablet once a day.  11.  Atorvastatin 40 mg 1 tablet once a day.  12.  Docusate sodium 100 mg 1 capsule 2 times a day as needed for constipation.  13.  Levaquin 500 mg tablet, 1 tablet every 24 hours, with a stop date of 04/24/2014.   DISCHARGE INSTRUCTIONS:  DIET: Low-fat, low-cholesterol diet.  ACTIVITY: As tolerated.  TIMEFRAME FOR FOLLOWUP: 1 to 2 weeks with primary care physician.   TIME SPENT ON DISCHARGE: 40 minutes    ____________________________ Ena Dawley. Clent Ridges, MD cpw:MT D: 04/19/2014 10:56:44 ET T: 04/19/2014 11:47:23 ET JOB#: [id number]  cc: Santina Evans P. Clent Ridges, MD, <Dictator> Gale Journey MD ELECTRONICALLY SIGNED 04/22/2014 11:47

## 2014-11-14 DIAGNOSIS — J449 Chronic obstructive pulmonary disease, unspecified: Secondary | ICD-10-CM | POA: Diagnosis not present

## 2014-12-08 DIAGNOSIS — Z6822 Body mass index (BMI) 22.0-22.9, adult: Secondary | ICD-10-CM | POA: Diagnosis not present

## 2014-12-08 DIAGNOSIS — N390 Urinary tract infection, site not specified: Secondary | ICD-10-CM | POA: Diagnosis not present

## 2014-12-14 DIAGNOSIS — J449 Chronic obstructive pulmonary disease, unspecified: Secondary | ICD-10-CM | POA: Diagnosis not present

## 2015-01-14 DIAGNOSIS — J449 Chronic obstructive pulmonary disease, unspecified: Secondary | ICD-10-CM | POA: Diagnosis not present

## 2015-01-19 DIAGNOSIS — E782 Mixed hyperlipidemia: Secondary | ICD-10-CM | POA: Diagnosis not present

## 2015-01-19 DIAGNOSIS — J449 Chronic obstructive pulmonary disease, unspecified: Secondary | ICD-10-CM | POA: Diagnosis not present

## 2015-01-19 DIAGNOSIS — Z23 Encounter for immunization: Secondary | ICD-10-CM | POA: Diagnosis not present

## 2015-01-19 DIAGNOSIS — I10 Essential (primary) hypertension: Secondary | ICD-10-CM | POA: Diagnosis not present

## 2015-01-19 DIAGNOSIS — N183 Chronic kidney disease, stage 3 (moderate): Secondary | ICD-10-CM | POA: Diagnosis not present

## 2015-01-19 DIAGNOSIS — Z9181 History of falling: Secondary | ICD-10-CM | POA: Diagnosis not present

## 2015-02-14 DIAGNOSIS — J449 Chronic obstructive pulmonary disease, unspecified: Secondary | ICD-10-CM | POA: Diagnosis not present

## 2015-02-16 DIAGNOSIS — Z7689 Persons encountering health services in other specified circumstances: Secondary | ICD-10-CM | POA: Diagnosis not present

## 2015-02-16 DIAGNOSIS — Z6820 Body mass index (BMI) 20.0-20.9, adult: Secondary | ICD-10-CM | POA: Diagnosis not present

## 2015-02-16 DIAGNOSIS — R300 Dysuria: Secondary | ICD-10-CM | POA: Diagnosis not present

## 2015-02-16 DIAGNOSIS — N390 Urinary tract infection, site not specified: Secondary | ICD-10-CM | POA: Diagnosis not present

## 2015-03-16 DIAGNOSIS — J449 Chronic obstructive pulmonary disease, unspecified: Secondary | ICD-10-CM | POA: Diagnosis not present

## 2015-04-10 DIAGNOSIS — I10 Essential (primary) hypertension: Secondary | ICD-10-CM | POA: Diagnosis not present

## 2015-04-10 DIAGNOSIS — I429 Cardiomyopathy, unspecified: Secondary | ICD-10-CM | POA: Diagnosis not present

## 2015-04-11 DIAGNOSIS — G309 Alzheimer's disease, unspecified: Secondary | ICD-10-CM | POA: Diagnosis not present

## 2015-04-11 DIAGNOSIS — M549 Dorsalgia, unspecified: Secondary | ICD-10-CM | POA: Diagnosis not present

## 2015-04-11 DIAGNOSIS — R29810 Facial weakness: Secondary | ICD-10-CM | POA: Diagnosis not present

## 2015-04-16 DIAGNOSIS — J449 Chronic obstructive pulmonary disease, unspecified: Secondary | ICD-10-CM | POA: Diagnosis not present

## 2015-04-21 DIAGNOSIS — L98491 Non-pressure chronic ulcer of skin of other sites limited to breakdown of skin: Secondary | ICD-10-CM | POA: Diagnosis not present

## 2015-04-27 DIAGNOSIS — I509 Heart failure, unspecified: Secondary | ICD-10-CM | POA: Diagnosis not present

## 2015-04-27 DIAGNOSIS — I5040 Unspecified combined systolic (congestive) and diastolic (congestive) heart failure: Secondary | ICD-10-CM | POA: Diagnosis not present

## 2015-04-27 DIAGNOSIS — F0390 Unspecified dementia without behavioral disturbance: Secondary | ICD-10-CM | POA: Diagnosis not present

## 2015-04-27 DIAGNOSIS — I10 Essential (primary) hypertension: Secondary | ICD-10-CM | POA: Diagnosis not present

## 2015-04-27 DIAGNOSIS — S71102A Unspecified open wound, left thigh, initial encounter: Secondary | ICD-10-CM | POA: Diagnosis not present

## 2015-04-27 DIAGNOSIS — J449 Chronic obstructive pulmonary disease, unspecified: Secondary | ICD-10-CM | POA: Diagnosis not present

## 2015-05-03 ENCOUNTER — Emergency Department: Payer: Medicare Other

## 2015-05-03 ENCOUNTER — Inpatient Hospital Stay
Admission: EM | Admit: 2015-05-03 | Discharge: 2015-05-07 | DRG: 871 | Disposition: A | Discharge status: Home Health | Payer: Medicare Other | Attending: Internal Medicine | Admitting: Internal Medicine

## 2015-05-03 DIAGNOSIS — R6520 Severe sepsis without septic shock: Secondary | ICD-10-CM

## 2015-05-03 DIAGNOSIS — Z87891 Personal history of nicotine dependence: Secondary | ICD-10-CM | POA: Diagnosis not present

## 2015-05-03 DIAGNOSIS — R55 Syncope and collapse: Secondary | ICD-10-CM

## 2015-05-03 DIAGNOSIS — Z8673 Personal history of transient ischemic attack (TIA), and cerebral infarction without residual deficits: Secondary | ICD-10-CM

## 2015-05-03 DIAGNOSIS — I129 Hypertensive chronic kidney disease with stage 1 through stage 4 chronic kidney disease, or unspecified chronic kidney disease: Secondary | ICD-10-CM | POA: Diagnosis not present

## 2015-05-03 DIAGNOSIS — N390 Urinary tract infection, site not specified: Secondary | ICD-10-CM | POA: Diagnosis not present

## 2015-05-03 DIAGNOSIS — J189 Pneumonia, unspecified organism: Secondary | ICD-10-CM

## 2015-05-03 DIAGNOSIS — N289 Disorder of kidney and ureter, unspecified: Secondary | ICD-10-CM

## 2015-05-03 DIAGNOSIS — I6529 Occlusion and stenosis of unspecified carotid artery: Secondary | ICD-10-CM | POA: Diagnosis present

## 2015-05-03 DIAGNOSIS — R197 Diarrhea, unspecified: Secondary | ICD-10-CM

## 2015-05-03 DIAGNOSIS — E872 Acidosis: Secondary | ICD-10-CM | POA: Diagnosis present

## 2015-05-03 DIAGNOSIS — F0280 Dementia in other diseases classified elsewhere without behavioral disturbance: Secondary | ICD-10-CM | POA: Diagnosis present

## 2015-05-03 DIAGNOSIS — J181 Lobar pneumonia, unspecified organism: Secondary | ICD-10-CM

## 2015-05-03 DIAGNOSIS — I252 Old myocardial infarction: Secondary | ICD-10-CM

## 2015-05-03 DIAGNOSIS — N3000 Acute cystitis without hematuria: Secondary | ICD-10-CM | POA: Diagnosis not present

## 2015-05-03 DIAGNOSIS — M6281 Muscle weakness (generalized): Secondary | ICD-10-CM | POA: Diagnosis not present

## 2015-05-03 DIAGNOSIS — Z8249 Family history of ischemic heart disease and other diseases of the circulatory system: Secondary | ICD-10-CM | POA: Diagnosis not present

## 2015-05-03 DIAGNOSIS — M810 Age-related osteoporosis without current pathological fracture: Secondary | ICD-10-CM | POA: Diagnosis present

## 2015-05-03 DIAGNOSIS — N183 Chronic kidney disease, stage 3 unspecified: Secondary | ICD-10-CM

## 2015-05-03 DIAGNOSIS — J690 Pneumonitis due to inhalation of food and vomit: Secondary | ICD-10-CM | POA: Diagnosis present

## 2015-05-03 DIAGNOSIS — E860 Dehydration: Secondary | ICD-10-CM | POA: Diagnosis present

## 2015-05-03 DIAGNOSIS — Z90710 Acquired absence of both cervix and uterus: Secondary | ICD-10-CM | POA: Diagnosis not present

## 2015-05-03 DIAGNOSIS — Z888 Allergy status to other drugs, medicaments and biological substances status: Secondary | ICD-10-CM | POA: Diagnosis not present

## 2015-05-03 DIAGNOSIS — Z9841 Cataract extraction status, right eye: Secondary | ICD-10-CM | POA: Diagnosis not present

## 2015-05-03 DIAGNOSIS — Z9842 Cataract extraction status, left eye: Secondary | ICD-10-CM | POA: Diagnosis not present

## 2015-05-03 DIAGNOSIS — A419 Sepsis, unspecified organism: Principal | ICD-10-CM | POA: Diagnosis present

## 2015-05-03 DIAGNOSIS — Z789 Other specified health status: Secondary | ICD-10-CM

## 2015-05-03 DIAGNOSIS — Z7982 Long term (current) use of aspirin: Secondary | ICD-10-CM | POA: Diagnosis not present

## 2015-05-03 DIAGNOSIS — E782 Mixed hyperlipidemia: Secondary | ICD-10-CM | POA: Diagnosis present

## 2015-05-03 DIAGNOSIS — R1310 Dysphagia, unspecified: Secondary | ICD-10-CM | POA: Diagnosis present

## 2015-05-03 DIAGNOSIS — I639 Cerebral infarction, unspecified: Secondary | ICD-10-CM

## 2015-05-03 DIAGNOSIS — Z79899 Other long term (current) drug therapy: Secondary | ICD-10-CM

## 2015-05-03 DIAGNOSIS — I2510 Atherosclerotic heart disease of native coronary artery without angina pectoris: Secondary | ICD-10-CM | POA: Diagnosis present

## 2015-05-03 DIAGNOSIS — G309 Alzheimer's disease, unspecified: Secondary | ICD-10-CM | POA: Diagnosis not present

## 2015-05-03 DIAGNOSIS — G9341 Metabolic encephalopathy: Secondary | ICD-10-CM | POA: Diagnosis present

## 2015-05-03 DIAGNOSIS — B9620 Unspecified Escherichia coli [E. coli] as the cause of diseases classified elsewhere: Secondary | ICD-10-CM | POA: Diagnosis present

## 2015-05-03 DIAGNOSIS — Z23 Encounter for immunization: Secondary | ICD-10-CM

## 2015-05-03 HISTORY — DX: Alzheimer's disease, unspecified: G30.9

## 2015-05-03 HISTORY — DX: Dementia in other diseases classified elsewhere, unspecified severity, without behavioral disturbance, psychotic disturbance, mood disturbance, and anxiety: F02.80

## 2015-05-03 LAB — COMPREHENSIVE METABOLIC PANEL
ALK PHOS: 117 U/L (ref 38–126)
ALT: 11 U/L — AB (ref 14–54)
AST: 26 U/L (ref 15–41)
Albumin: 3.1 g/dL — ABNORMAL LOW (ref 3.5–5.0)
Anion gap: 9 (ref 5–15)
BILIRUBIN TOTAL: 0.4 mg/dL (ref 0.3–1.2)
BUN: 17 mg/dL (ref 6–20)
CALCIUM: 8.8 mg/dL — AB (ref 8.9–10.3)
CO2: 24 mmol/L (ref 22–32)
CREATININE: 1.44 mg/dL — AB (ref 0.44–1.00)
Chloride: 109 mmol/L (ref 101–111)
GFR, EST AFRICAN AMERICAN: 38 mL/min — AB (ref >60)
GFR, EST NON AFRICAN AMERICAN: 33 mL/min — AB (ref >60)
Glucose, Bld: 199 mg/dL — ABNORMAL HIGH (ref 65–99)
Potassium: 4.3 mmol/L (ref 3.5–5.1)
Sodium: 142 mmol/L (ref 135–145)
Total Protein: 7.6 g/dL (ref 6.5–8.1)

## 2015-05-03 LAB — CBC WITH DIFFERENTIAL/PLATELET
BASOS ABS: 0 10*3/uL (ref 0–0.1)
BASOS PCT: 0 %
EOS PCT: 1 %
Eosinophils Absolute: 0.2 10*3/uL (ref 0–0.7)
HEMATOCRIT: 42.5 % (ref 35.0–47.0)
Hemoglobin: 13.5 g/dL (ref 12.0–16.0)
Lymphocytes Relative: 9 %
Lymphs Abs: 1.5 10*3/uL (ref 1.0–3.6)
MCH: 29.2 pg (ref 26.0–34.0)
MCHC: 31.8 g/dL — ABNORMAL LOW (ref 32.0–36.0)
MCV: 91.9 fL (ref 80.0–100.0)
MONO ABS: 0.9 10*3/uL (ref 0.2–0.9)
Monocytes Relative: 5 %
Neutro Abs: 13.7 10*3/uL — ABNORMAL HIGH (ref 1.4–6.5)
Neutrophils Relative %: 85 %
Platelets: 216 10*3/uL (ref 150–440)
RBC: 4.63 MIL/uL (ref 3.80–5.20)
RDW: 15.1 % — AB (ref 11.5–14.5)
WBC: 16.3 10*3/uL — ABNORMAL HIGH (ref 3.6–11.0)

## 2015-05-03 LAB — LACTIC ACID, PLASMA: LACTIC ACID, VENOUS: 5 mmol/L — AB (ref 0.5–2.0)

## 2015-05-03 LAB — PROTIME-INR
INR: 1.14
PROTHROMBIN TIME: 14.8 s (ref 11.4–15.0)

## 2015-05-03 LAB — APTT: aPTT: 30 seconds (ref 24–36)

## 2015-05-03 LAB — TROPONIN I

## 2015-05-03 LAB — LIPASE, BLOOD: LIPASE: 51 U/L (ref 11–51)

## 2015-05-03 MED ORDER — PIPERACILLIN-TAZOBACTAM 3.375 G IVPB 30 MIN
3.3750 g | Freq: Once | INTRAVENOUS | Status: AC
  Administered 2015-05-04: 3.375 g via INTRAVENOUS
  Filled 2015-05-03: qty 50

## 2015-05-03 MED ORDER — VANCOMYCIN HCL IN DEXTROSE 1-5 GM/200ML-% IV SOLN
1000.0000 mg | Freq: Once | INTRAVENOUS | Status: AC
  Administered 2015-05-04: 1000 mg via INTRAVENOUS
  Filled 2015-05-03: qty 200

## 2015-05-03 MED ORDER — SODIUM CHLORIDE 0.9 % IV BOLUS (SEPSIS)
1000.0000 mL | INTRAVENOUS | Status: AC
  Administered 2015-05-03 – 2015-05-04 (×2): 1000 mL via INTRAVENOUS

## 2015-05-03 NOTE — ED Notes (Signed)
Per EMS, pt was found on the floor of the bathroom covered with stool, and was placed in the bathtub by family.  Pt passed out in tub twice before EMS arrived. Patient was grey and ashen upon EMS arrival.  Pt c/o diarrhea, and syncope. EMS unable to get BP intimally, later got 108/65.   EMS started 18 gauge IVs in both ACs, gave 300 mL of NaCl.  Pt's BP then 126/73.  Pt CBG 218.  Pt has a hx of dementia and is allergic to tramadol.

## 2015-05-03 NOTE — ED Provider Notes (Addendum)
[facility] Emergency Department Provider Note  ____________________________________________  Time seen: Approximately 10:13 PM  I have reviewed the triage vital signs and the nursing notes.   HISTORY  Chief Complaint Loss of Consciousness  History is limited by dementia and critical illness  HPI Kathleen Graham is a 81 y.o. female arriving by EMS as emergency traffic for unresponsiveness.  The only known medical history provided by oral report is dementia and recent severe diarrhea.  EMS was called out for apparently multiple episodes of her becoming unresponsive after repeated episodes of diarrhea today.  When EMS arrived the patient had been moved to the bathtub to be cleaned out for apparently she passed out several times.  She was ashen in appearance and minimally responsive.  She did not have palpable radial pulses at the time.  They established 2 peripheral IVs and she received a 300 mL bolus in route to the hospital and she became more responsive.  She is very cool to the touch and is moaning but is not talking coherently, although the extent of her baseline dementia is unknown at this time.  Her status is critical and her symptoms are severe, but other history is not available at this time.   Past Medical History  Diagnosis Date  . Backache, unspecified   . Full incontinence of feces   . Mixed hyperlipidemia   . History of acute myocardial infarction   . Cystocele, midline   . Coronary atherosclerosis of unspecified type of vessel, native or graft   . Osteoporosis, unspecified 07/03/12    Hip and foremar by DEXA  . 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 Hyperuricemia     Due to HCTZ  . Unspecified urinary incontinence   . Lipoprotein deficiencies   . Unspecified cataract   . History of TIA (transient ischemic attack) 2005  . Tobacco use disorder 09/01/2007  . Other B-complex deficiencies   . Other persistent mental disorders  due to conditions classified elsewhere 08-2007  . Essential hypertension, benign   . Insomnia, unspecified   . Chronic kidney disease, stage III (moderate) 12/01/2007  . Memory deficits 11/19/2012  . Abnormality of gait 11/19/2012  . Alzheimer's dementia     stage 4    Patient Active Problem List   Diagnosis Date Noted  . COPD (chronic obstructive pulmonary disease) (HCC) 07/08/2013  . Other and unspecified hyperlipidemia 07/08/2013  . Osteoporosis, unspecified 07/08/2013  . History of myocardial infarct at age greater than 60 years 07/08/2013  . Syncope 07/07/2013  . Memory deficits 11/19/2012  . Abnormality of gait 11/19/2012    Past Surgical History  Procedure Laterality Date  . Cataract extraction Bilateral   . Abdominal hysterectomy      Current Outpatient Rx  Name  Route  Sig  Dispense  Refill  . albuterol (PROVENTIL HFA;VENTOLIN HFA) 108 (90 BASE) MCG/ACT inhaler   Inhalation   Inhale 2 puffs into the lungs every 4 (four) hours as needed for wheezing or shortness of breath.         Marland Kitchen albuterol (PROVENTIL) (2.5 MG/3ML) 0.083% nebulizer solution   Nebulization   Take 2.5 mg by nebulization every 6 (six) hours as needed for wheezing.         Marland Kitchen aspirin 81 MG tablet   Oral   Take 162 mg by mouth daily.          . citalopram (CELEXA) 20 MG tablet   Oral   Take  20 mg by mouth daily.         Marland Kitchen tiotropium (SPIRIVA) 18 MCG inhalation capsule   Inhalation   Place 18 mcg into inhaler and inhale daily.         . vitamin B-12 (CYANOCOBALAMIN) 1000 MCG tablet   Oral   Take 1,000 mcg by mouth daily.         Marland Kitchen acetaminophen (TYLENOL) 500 MG tablet   Oral   Take 2 tablets (1,000 mg total) by mouth 3 (three) times daily as needed. Patient not taking: Reported on 05/03/2015      0     Dispense as written.     Allergies Hydrochlorothiazide and Tramadol  Family History  Problem Relation Age of Onset  . Heart attack Son     Social History Social  History  Substance Use Topics  . Smoking status: Former Smoker    Quit date: 06/11/2011  . Smokeless tobacco: None  . Alcohol Use: No    Review of Systems Unable to obtain secondary to critical illness and dementia  ____________________________________________   PHYSICAL EXAM:  ED Triage Vitals  Enc Vitals Group     BP 05/03/15 2222 106/93 mmHg     Pulse Rate 05/03/15 2222 97     Resp 05/03/15 2222 20     Temp 05/03/15 2222 97.4 F (36.3 C)     Temp Source 05/03/15 2222 Rectal     SpO2 05/03/15 2222 90 %     Weight 05/03/15 2245 135 lb (61.236 kg)     Height 05/03/15 2245 5' 5.5" (1.664 m)     Head Cir --      Peak Flow --      Pain Score 05/03/15 2244 0     Pain Loc --      Pain Edu? --      Excl. in GC? --      Constitutional: Ill appearing, states she is cold, otherwise not interacting significantly Eyes: PERRL. EOMI. Head: Atraumatic. Nose: No congestion/rhinnorhea. Mouth/Throat: Mucous membranes are dry.  Oropharynx non-erythematous. Neck: No stridor.   Cardiovascular: Normal rate, regular rhythm. Grossly normal heart sounds.  Poor peripheral circulation with slow cap refill. Respiratory: Normal respiratory effort.  No retractions. Lungs CTAB. Gastrointestinal: Soft and nontender. No distention. No abdominal bruits. No CVA tenderness. Musculoskeletal: No lower extremity tenderness nor edema.  No joint effusions. Neurologic:  Normal speech and language. No gross focal neurologic deficits are appreciated.  Skin:  Skin is cool, dry and intact. No rash noted. Poor skin turgor.  Ashen/gray in color   ____________________________________________   LABS (all labs ordered are listed, but only abnormal results are displayed)  Labs Reviewed  COMPREHENSIVE METABOLIC PANEL - Abnormal; Notable for the following:    Glucose, Bld 199 (*)    Creatinine, Ser 1.44 (*)    Calcium 8.8 (*)    Albumin 3.1 (*)    ALT 11 (*)    GFR calc non Af Amer 33 (*)    GFR calc Af  Amer 38 (*)    All other components within normal limits  CBC WITH DIFFERENTIAL/PLATELET - Abnormal; Notable for the following:    WBC 16.3 (*)    MCHC 31.8 (*)    RDW 15.1 (*)    Neutro Abs 13.7 (*)    All other components within normal limits  CULTURE, BLOOD (ROUTINE X 2)  CULTURE, BLOOD (ROUTINE X 2)  URINE CULTURE  C DIFFICILE QUICK SCREEN W PCR REFLEX  LIPASE, BLOOD  TROPONIN I  APTT  PROTIME-INR  LACTIC ACID, PLASMA  LACTIC ACID, PLASMA  URINALYSIS COMPLETEWITH MICROSCOPIC ([facility] ONLY)   ____________________________________________  EKG  ED ECG REPORT I, Amarri Michaelson, the attending physician, personally viewed and interpreted this ECG.  Date: 05/03/2015 EKG Time: 22:45 Rate: 87 Rhythm: normal sinus rhythm QRS Axis: normal Intervals: normal ST/T Wave abnormalities: normal Conduction Disutrbances: none Narrative Interpretation: There is heavy artifact present because the patient is trembling, but there is no evidence of acute ischemia on her EKG.  ____________________________________________  RADIOLOGY   Dg Chest Port 1 View  05/03/2015  CLINICAL DATA:  Initial evaluation for acute sepsis. EXAM: PORTABLE CHEST 1 VIEW COMPARISON:  Prior study from 10/04/2014. FINDINGS: Cardiac silhouette is stable in size and contour common remains within normal limits. Prominent atheromatous plaque within aortic arch. Probable hiatal hernia. Lungs are hypoinflated with chronic interstitial fibrotic changes again noted. Asymmetric opacity at the left lung base again seen, which may reflect fibrotic changes or possibly pneumonia. Overall, aeration is slightly improved in this region as compared to most recent chest radiograph. No pulmonary edema. No pleural effusion or pneumothorax. No acute osseus abnormality. IMPRESSION: Reduced lung volumes with chronic interstitial fibrotic changes. While asymmetric opacity at the left lung base is similar to previous study, possible superimposed  pneumonia may also be present. Electronically Signed   By: Rise MuBenjamin  McClintock M.D.   On: 05/03/2015 22:38    ____________________________________________   PROCEDURES  Procedure(s) performed: None  Critical Care performed: Yes, see critical care note(s)   CRITICAL CARE Performed by: Loleta RoseFORBACH, Ivery Michalski   Total critical care time: 30 minutes  Critical care time was exclusive of separately billable procedures and treating other patients.  Critical care was necessary to treat or prevent imminent or life-threatening deterioration.  Critical care was time spent personally by me on the following activities: development of treatment plan with patient and/or surrogate as well as nursing, discussions with consultants, evaluation of patient's response to treatment, examination of patient, obtaining history from patient or surrogate, ordering and performing treatments and interventions, ordering and review of laboratory studies, ordering and review of radiographic studies, pulse oximetry and re-evaluation of patient's condition.  ____________________________________________   INITIAL IMPRESSION / ASSESSMENT AND PLAN / ED COURSE  Pertinent labs & imaging results that were available during my care of the patient were reviewed by me and considered in my medical decision making (see chart for details).  The patient looks ill upon arrival but is difficult to know what her baseline is.  Given the multiple recent episodes of diarrhea, she may be volume depleted/dehydrated and likely had a vasovagal episode while having bowel movements.  Given her ill appearance and the concerning report from EMS, we will treat her initially as sepsis, but her initial vital signs are all reassuring with normal blood pressure, normal heart rate, and a normal temperature.  We will provide IV fluids and continue to reassess his lab work comes back.  ----------------------------------------- 11:40 PM on  05/03/2015 -----------------------------------------  Hemodynamically stable.  Leukocytosis of >16.  IO urine still pending, chemistry pending.  Holding empiric antibiotics given the probability of stool infection where empiric abx would not be advisable, but will likely need them if urine is positive.  Transferring care to Dr. Dolores FrameSung.  Discussed with family.  ----------------------------------------- 11:53 PM on 05/03/2015 -----------------------------------------  Lactate of 5.  Ordering empiric broad spectrum abx, proceeding with code sepsis. ____________________________________________  FINAL CLINICAL IMPRESSION(S) / ED DIAGNOSES  Final diagnoses:  Diarrhea, unspecified type  Syncope and collapse  Severe sepsis (HCC)  UTI (lower urinary tract infection)      NEW MEDICATIONS STARTED DURING THIS VISIT:  New Prescriptions   No medications on file     Loleta Rose, MD 05/03/15 [id number]  Loleta Rose, MD 05/05/15 [id number]

## 2015-05-04 ENCOUNTER — Encounter: Payer: Self-pay | Admitting: Internal Medicine

## 2015-05-04 DIAGNOSIS — N183 Chronic kidney disease, stage 3 (moderate): Secondary | ICD-10-CM | POA: Diagnosis not present

## 2015-05-04 DIAGNOSIS — G9341 Metabolic encephalopathy: Secondary | ICD-10-CM | POA: Diagnosis not present

## 2015-05-04 DIAGNOSIS — R55 Syncope and collapse: Secondary | ICD-10-CM | POA: Diagnosis not present

## 2015-05-04 DIAGNOSIS — R1310 Dysphagia, unspecified: Secondary | ICD-10-CM | POA: Diagnosis not present

## 2015-05-04 DIAGNOSIS — Z90710 Acquired absence of both cervix and uterus: Secondary | ICD-10-CM | POA: Diagnosis not present

## 2015-05-04 DIAGNOSIS — Z8249 Family history of ischemic heart disease and other diseases of the circulatory system: Secondary | ICD-10-CM | POA: Diagnosis not present

## 2015-05-04 DIAGNOSIS — E872 Acidosis: Secondary | ICD-10-CM | POA: Diagnosis not present

## 2015-05-04 DIAGNOSIS — G309 Alzheimer's disease, unspecified: Secondary | ICD-10-CM | POA: Diagnosis not present

## 2015-05-04 DIAGNOSIS — Z888 Allergy status to other drugs, medicaments and biological substances status: Secondary | ICD-10-CM | POA: Diagnosis not present

## 2015-05-04 DIAGNOSIS — Z7982 Long term (current) use of aspirin: Secondary | ICD-10-CM | POA: Diagnosis not present

## 2015-05-04 DIAGNOSIS — M810 Age-related osteoporosis without current pathological fracture: Secondary | ICD-10-CM | POA: Diagnosis not present

## 2015-05-04 DIAGNOSIS — Z79899 Other long term (current) drug therapy: Secondary | ICD-10-CM | POA: Diagnosis not present

## 2015-05-04 DIAGNOSIS — I2510 Atherosclerotic heart disease of native coronary artery without angina pectoris: Secondary | ICD-10-CM | POA: Diagnosis not present

## 2015-05-04 DIAGNOSIS — E782 Mixed hyperlipidemia: Secondary | ICD-10-CM | POA: Diagnosis not present

## 2015-05-04 DIAGNOSIS — I252 Old myocardial infarction: Secondary | ICD-10-CM | POA: Diagnosis not present

## 2015-05-04 DIAGNOSIS — J189 Pneumonia, unspecified organism: Secondary | ICD-10-CM | POA: Diagnosis not present

## 2015-05-04 DIAGNOSIS — A419 Sepsis, unspecified organism: Secondary | ICD-10-CM | POA: Diagnosis present

## 2015-05-04 DIAGNOSIS — Z789 Other specified health status: Secondary | ICD-10-CM | POA: Diagnosis not present

## 2015-05-04 DIAGNOSIS — I639 Cerebral infarction, unspecified: Secondary | ICD-10-CM | POA: Diagnosis not present

## 2015-05-04 DIAGNOSIS — Z87891 Personal history of nicotine dependence: Secondary | ICD-10-CM | POA: Diagnosis not present

## 2015-05-04 DIAGNOSIS — N390 Urinary tract infection, site not specified: Secondary | ICD-10-CM | POA: Diagnosis not present

## 2015-05-04 DIAGNOSIS — N3000 Acute cystitis without hematuria: Secondary | ICD-10-CM | POA: Diagnosis not present

## 2015-05-04 DIAGNOSIS — E860 Dehydration: Secondary | ICD-10-CM | POA: Diagnosis not present

## 2015-05-04 DIAGNOSIS — I129 Hypertensive chronic kidney disease with stage 1 through stage 4 chronic kidney disease, or unspecified chronic kidney disease: Secondary | ICD-10-CM | POA: Diagnosis not present

## 2015-05-04 DIAGNOSIS — J690 Pneumonitis due to inhalation of food and vomit: Secondary | ICD-10-CM | POA: Diagnosis not present

## 2015-05-04 DIAGNOSIS — I6529 Occlusion and stenosis of unspecified carotid artery: Secondary | ICD-10-CM | POA: Diagnosis not present

## 2015-05-04 DIAGNOSIS — N189 Chronic kidney disease, unspecified: Secondary | ICD-10-CM | POA: Diagnosis not present

## 2015-05-04 DIAGNOSIS — B9620 Unspecified Escherichia coli [E. coli] as the cause of diseases classified elsewhere: Secondary | ICD-10-CM | POA: Diagnosis present

## 2015-05-04 DIAGNOSIS — Z8673 Personal history of transient ischemic attack (TIA), and cerebral infarction without residual deficits: Secondary | ICD-10-CM | POA: Diagnosis not present

## 2015-05-04 DIAGNOSIS — Z9841 Cataract extraction status, right eye: Secondary | ICD-10-CM | POA: Diagnosis not present

## 2015-05-04 DIAGNOSIS — N289 Disorder of kidney and ureter, unspecified: Secondary | ICD-10-CM | POA: Diagnosis not present

## 2015-05-04 DIAGNOSIS — Z9842 Cataract extraction status, left eye: Secondary | ICD-10-CM | POA: Diagnosis not present

## 2015-05-04 DIAGNOSIS — F0280 Dementia in other diseases classified elsewhere without behavioral disturbance: Secondary | ICD-10-CM | POA: Diagnosis not present

## 2015-05-04 LAB — URINALYSIS COMPLETE WITH MICROSCOPIC (ARMC ONLY)
BILIRUBIN URINE: NEGATIVE
Glucose, UA: NEGATIVE mg/dL
Hgb urine dipstick: NEGATIVE
KETONES UR: NEGATIVE mg/dL
NITRITE: POSITIVE — AB
PH: 5 (ref 5.0–8.0)
Protein, ur: 30 mg/dL — AB
Specific Gravity, Urine: 1.023 (ref 1.005–1.030)

## 2015-05-04 LAB — CBC
HCT: 36.5 % (ref 35.0–47.0)
Hemoglobin: 12.3 g/dL (ref 12.0–16.0)
MCH: 30.9 pg (ref 26.0–34.0)
MCHC: 33.6 g/dL (ref 32.0–36.0)
MCV: 92 fL (ref 80.0–100.0)
PLATELETS: 163 10*3/uL (ref 150–440)
RBC: 3.97 MIL/uL (ref 3.80–5.20)
RDW: 15 % — AB (ref 11.5–14.5)
WBC: 9.2 10*3/uL (ref 3.6–11.0)

## 2015-05-04 LAB — BASIC METABOLIC PANEL
Anion gap: 8 (ref 5–15)
BUN: 14 mg/dL (ref 6–20)
CALCIUM: 8.2 mg/dL — AB (ref 8.9–10.3)
CO2: 21 mmol/L — ABNORMAL LOW (ref 22–32)
CREATININE: 1.25 mg/dL — AB (ref 0.44–1.00)
Chloride: 113 mmol/L — ABNORMAL HIGH (ref 101–111)
GFR calc non Af Amer: 39 mL/min — ABNORMAL LOW (ref >60)
GFR, EST AFRICAN AMERICAN: 45 mL/min — AB (ref >60)
Glucose, Bld: 100 mg/dL — ABNORMAL HIGH (ref 65–99)
Potassium: 4.4 mmol/L (ref 3.5–5.1)
SODIUM: 142 mmol/L (ref 135–145)

## 2015-05-04 LAB — LACTIC ACID, PLASMA
LACTIC ACID, VENOUS: 2.9 mmol/L — AB (ref 0.5–2.0)
Lactic Acid, Venous: 3.9 mmol/L (ref 0.5–2.0)
Lactic Acid, Venous: 2 mmol/L (ref 0.5–2.0)

## 2015-05-04 MED ORDER — ONDANSETRON HCL 4 MG PO TABS: 4.0000 mg | ORAL_TABLET | Freq: Four times a day (QID) | ORAL | Status: DC | PRN

## 2015-05-04 MED ORDER — MORPHINE SULFATE (PF) 2 MG/ML IV SOLN: 2.0000 mg | INTRAVENOUS | Status: DC | PRN

## 2015-05-04 MED ORDER — TIOTROPIUM BROMIDE MONOHYDRATE 18 MCG IN CAPS
18.0000 ug | ORAL_CAPSULE | Freq: Every day | RESPIRATORY_TRACT | Status: DC
  Administered 2015-05-04 – 2015-05-07 (×4): 18 ug via RESPIRATORY_TRACT
  Filled 2015-05-04: qty 5

## 2015-05-04 MED ORDER — PIPERACILLIN-TAZOBACTAM 3.375 G IVPB
3.3750 g | Freq: Three times a day (TID) | INTRAVENOUS | Status: DC
  Administered 2015-05-04 – 2015-05-07 (×10): 3.375 g via INTRAVENOUS
  Filled 2015-05-04 (×14): qty 50

## 2015-05-04 MED ORDER — ALBUTEROL SULFATE (2.5 MG/3ML) 0.083% IN NEBU: 3.0000 mL | INHALATION_SOLUTION | RESPIRATORY_TRACT | Status: DC | PRN

## 2015-05-04 MED ORDER — ASPIRIN 81 MG PO CHEW
162.0000 mg | CHEWABLE_TABLET | Freq: Every day | ORAL | Status: DC
  Administered 2015-05-04 – 2015-05-07 (×4): 162 mg via ORAL
  Filled 2015-05-04 (×4): qty 2

## 2015-05-04 MED ORDER — VANCOMYCIN HCL IN DEXTROSE 750-5 MG/150ML-% IV SOLN
750.0000 mg | INTRAVENOUS | Status: DC
  Administered 2015-05-04: 17:00:00 750 mg via INTRAVENOUS
  Filled 2015-05-04 (×2): qty 150

## 2015-05-04 MED ORDER — ONDANSETRON HCL 4 MG/2ML IJ SOLN: 4.0000 mg | Freq: Four times a day (QID) | INTRAMUSCULAR | Status: DC | PRN

## 2015-05-04 MED ORDER — SODIUM CHLORIDE 0.9 % IV BOLUS (SEPSIS): 1000.0000 mL | INTRAVENOUS | Status: DC

## 2015-05-04 MED ORDER — ENOXAPARIN SODIUM 30 MG/0.3ML ~~LOC~~ SOLN
30.0000 mg | Freq: Every day | SUBCUTANEOUS | Status: DC
  Administered 2015-05-04 – 2015-05-05 (×2): 30 mg via SUBCUTANEOUS
  Filled 2015-05-04 (×2): qty 0.3

## 2015-05-04 MED ORDER — SODIUM CHLORIDE 0.9 % IV SOLN
INTRAVENOUS | Status: DC
  Administered 2015-05-04 – 2015-05-07 (×7): via INTRAVENOUS

## 2015-05-04 MED ORDER — VITAMIN B-12 1000 MCG PO TABS
1000.0000 ug | ORAL_TABLET | Freq: Every day | ORAL | Status: DC
  Administered 2015-05-04 – 2015-05-07 (×4): 1000 ug via ORAL
  Filled 2015-05-04 (×4): qty 1

## 2015-05-04 MED ORDER — SODIUM CHLORIDE 0.9 % IJ SOLN
3.0000 mL | Freq: Two times a day (BID) | INTRAMUSCULAR | Status: DC
  Administered 2015-05-04 – 2015-05-06 (×6): 3 mL via INTRAVENOUS

## 2015-05-04 MED ORDER — ALBUTEROL SULFATE (2.5 MG/3ML) 0.083% IN NEBU: 2.5000 mg | INHALATION_SOLUTION | Freq: Four times a day (QID) | RESPIRATORY_TRACT | Status: DC | PRN

## 2015-05-04 MED ORDER — CITALOPRAM HYDROBROMIDE 20 MG PO TABS
20.0000 mg | ORAL_TABLET | Freq: Every day | ORAL | Status: DC
  Administered 2015-05-04 – 2015-05-07 (×4): 20 mg via ORAL
  Filled 2015-05-04 (×4): qty 1

## 2015-05-04 MED ORDER — METRONIDAZOLE IN NACL 5-0.79 MG/ML-% IV SOLN
500.0000 mg | Freq: Three times a day (TID) | INTRAVENOUS | Status: DC
  Administered 2015-05-04 (×2): 500 mg via INTRAVENOUS
  Filled 2015-05-04 (×4): qty 100

## 2015-05-04 MED ORDER — INFLUENZA VAC SPLIT QUAD 0.5 ML IM SUSY
0.5000 mL | PREFILLED_SYRINGE | INTRAMUSCULAR | Status: AC
  Administered 2015-05-05: 09:00:00 0.5 mL via INTRAMUSCULAR
  Filled 2015-05-04: qty 0.5

## 2015-05-04 NOTE — Progress Notes (Signed)
[facility] - [place] at [facility]   PATIENT NAME: Kathleen Graham    MR#:  [id number]  DATE OF BIRTH:  Jan 12, 1932  SUBJECTIVE:  CHIEF COMPLAINT:   Chief Complaint  Patient presents with  . Loss of Consciousness   The patient is a 81 year old Caucasian female with past medical history significant for history of fecal incontinence, hyperlipidemia, coronary artery disease, essential hypertension. CK D stage III Alzheimer's dementia who presents to the hospital with nausea, vomiting, diarrhea and syncopal episode at home. Unfortunately, patient is not able to provide review of systems due to dementia, but she denies any pain. Her vital signs are stable and she is in normal sinus rhythm. Chest is a revealed chronic interstitial changes, but left lung base revealed an opacity which was concerning for possible pneumonia. Labs revealed leukocytosis, acidosis, lactic, renal insufficiency , pyuria, concerning for UTI. Blood culture is pending. Stool culture as well as urine cultures were sent, pending. Coughing during my evaluation, no sputum production  Review of Systems  Unable to perform ROS: dementia    VITAL SIGNS: Blood pressure 135/65, pulse 86, temperature 98.7 F (37.1 C), temperature source Oral, resp. rate 16, height  (1.676 m), weight 63.73 kg (140 lb 8 oz), SpO2 100 %.  PHYSICAL EXAMINATION:   GENERAL:  81 y.o.-year-old patient lying in the bed with no acute distress. Talking to herself. Coughing intermittently EYES: Pupils equal, round, reactive to light and accommodation. No scleral icterus. Extraocular muscles intact.  HEENT: Head atraumatic, normocephalic. Oropharynx and nasopharynx clear. Dry oral mucosa NECK:  Supple, no jugular venous distention. No thyroid enlargement, no tenderness.  LUNGS: Normal breath sounds bilaterally, no wheezing, rales,rhonchi or crepitation. No use of accessory muscles of respiration.  CARDIOVASCULAR: S1, S2 normal. No  murmurs, rubs, or gallops.  ABDOMEN: Soft, nontender, nondistended. Bowel sounds present but diminished. No organomegaly or mass.  EXTREMITIES: No pedal edema, cyanosis, or clubbing.  NEUROLOGIC: Cranial nerves II through XII are grossly intact. Muscle strength , unable to evaluate. Sensation unable to evaluate. Gait not checked.  PSYCHIATRIC: The patient is alert , disoriented, minimal verbal interaction, intermittently follows commands.  SKIN: No obvious rash, lesion, or ulcer.   ORDERS/RESULTS REVIEWED:   CBC  Recent Labs Lab 05/03/15 2220 05/04/15 0727  WBC 16.3* 9.2  HGB 13.5 12.3  HCT 42.5 36.5  PLT 216 163  MCV 91.9 92.0  MCH 29.2 30.9  MCHC 31.8* 33.6  RDW 15.1* 15.0*  LYMPHSABS 1.5  --   MONOABS 0.9  --   EOSABS 0.2  --   BASOSABS 0.0  --    ------------------------------------------------------------------------------------------------------------------  Chemistries   Recent Labs Lab 05/03/15 2220 05/04/15 0727  NA 142 142  K 4.3 4.4  CL 109 113*  CO2 24 21*  GLUCOSE 199* 100*  BUN 17 14  CREATININE 1.44* 1.25*  CALCIUM 8.8* 8.2*  AST 26  --   ALT 11*  --   ALKPHOS 117  --   BILITOT 0.4  --    ------------------------------------------------------------------------------------------------------------------ estimated creatinine clearance is 33 mL/min (by C-G formula based on Cr of 1.25). ------------------------------------------------------------------------------------------------------------------ No results for input(s): TSH, T4TOTAL, T3FREE, THYROIDAB in the last 72 hours.  Invalid input(s): FREET3  Cardiac Enzymes  Recent Labs Lab 05/03/15 2220  TROPONINI <0.03   ------------------------------------------------------------------------------------------------------------------ Invalid input(s): POCBNP ---------------------------------------------------------------------------------------------------------------  RADIOLOGY: Dg Chest Port  1 View  05/03/2015  CLINICAL DATA:  Initial evaluation for acute sepsis. EXAM: PORTABLE CHEST 1 VIEW COMPARISON:  Prior study  from 10/04/2014. FINDINGS: Cardiac silhouette is stable in size and contour common remains within normal limits. Prominent atheromatous plaque within aortic arch. Probable hiatal hernia. Lungs are hypoinflated with chronic interstitial fibrotic changes again noted. Asymmetric opacity at the left lung base again seen, which may reflect fibrotic changes or possibly pneumonia. Overall, aeration is slightly improved in this region as compared to most recent chest radiograph. No pulmonary edema. No pleural effusion or pneumothorax. No acute osseus abnormality. IMPRESSION: Reduced lung volumes with chronic interstitial fibrotic changes. While asymmetric opacity at the left lung base is similar to previous study, possible superimposed pneumonia may also be present. Electronically Signed   By: Rise MuBenjamin  McClintock M.D.   On: 05/03/2015 22:38    EKG:  Orders placed or performed during the hospital encounter of 05/03/15  . EKG 12-Lead  . EKG 12-Lead  . EKG 12-Lead  . EKG 12-Lead  . EKG 12-Lead  . EKG 12-Lead    ASSESSMENT AND PLAN:  Principal Problem:   Sepsis (HCC) Active Problems:   UTI (urinary tract infection) 1. Sepsis due to urinary tract infection, blood cultures are pending, continue patient on vancomycin and Zosyn, following urine and stool cultures 2. Urinary tract infection, acute cystitis without hematuria. Continue antibiotic therapy following culture results 3. Renal insufficiency, chronic, improved with IV fluid administration 4. Syncope, likely dehydration related , Following closely  5. Community-acquired pneumonia, continue antibiotic therapy, broad-spectrum, get sputum cultures if possible 6. Nausea, vomiting, diarrhea, likely acute gastroenteritis, awaiting for blood cultures as well as stool cultures. Add  C. difficile testing   Management plans  discussed with the patient, family and they are in agreement.   DRUG ALLERGIES:  Allergies  Allergen Reactions  . Hydrochlorothiazide     Hyperurlcemia  . Tramadol     Leg weakness    CODE STATUS:     Code Status Orders        Start     Ordered   05/04/15 0303  Full code   Continuous     05/04/15 0302      TOTAL TIME TAKING CARE OF THIS PATIENT: 40 minutes.    Katharina CaperVAICKUTE,Zakhai Meisinger M.D on 05/04/2015 at 10:30 AM  Between 7am to 6pm - Pager - [phone]  After 6pm go to [email or web address] - password [email or web address] [facility]  [facility]  Office  [phone]  CC: Primary care physician; Ailene RavelHAMRICK,MAURA L, MD

## 2015-05-04 NOTE — Progress Notes (Signed)
Dr Winona LegatoVaickute verbally in person made aware of critical lactic acid result 2.9, no new orders

## 2015-05-04 NOTE — ED Provider Notes (Signed)
-----------------------------------------   12:16 AM on 05/04/2015 -----------------------------------------  Urinalysis results noted. IV antibiotics already hanging. Discussed with hospitalist who will evaluate patient in the ED for admission.  Irean HongJade J Sung, MD 05/04/15 [phone]

## 2015-05-04 NOTE — Plan of Care (Signed)
Problem: Education: Goal: Knowledge of [facility] Education information/materials will improve Outcome: Progressing Pt is alert to self, reoriented to location, situation and time. Hourly rounding.  Problem: Safety: Goal: Ability to remain free from injury will improve Outcome: Progressing Pt is High Fall Risk, educated about calling for assistance.   Problem: Pain Managment: Goal: General experience of comfort will improve Outcome: Progressing No signs or symptoms of distress at this time.  Problem: Bowel/Gastric: Goal: Will not experience complications related to bowel motility Outcome: Progressing Pt is on enteric precautions for r/o c-diff. No stools at this time.

## 2015-05-04 NOTE — Plan of Care (Addendum)
Problem: Urinary Elimination: Goal: Signs and symptoms of infection will decrease Outcome: Progressing Plan of care progress: -continue IV fluids, drinking fluids well -continue IV ABTs -monitor labs, WBC -lactic acid 2.0 -afebrile -no complaints pain, no distress or discomfort noted -tolerates diet, appetite fair

## 2015-05-04 NOTE — Progress Notes (Signed)
ANTIBIOTIC CONSULT NOTE - INITIAL  Pharmacy Consult for vancomycin/Zosyn Indication: rule out sepsis  Allergies  Allergen Reactions  . Hydrochlorothiazide     Hyperurlcemia  . Tramadol     Leg weakness    Patient Measurements: Height: 5' 5.5" (166.4 cm) Weight: 135 lb (61.236 kg) IBW/kg (Calculated) : 58.15 Adjusted Body Weight: 59.4 kg  Vital Signs: Temp: 97.4 F (36.3 C) (11/23 2222) Temp Source: Rectal (11/23 2222) BP: 130/74 mmHg (11/24 0007) Pulse Rate: 89 (11/24 0007) Intake/Output from previous day: 11/23 0701 - 11/24 0700 In: 300 [I.V.:300] Out: -  Intake/Output from this shift: Total I/O In: 300 [I.V.:300] Out: -   Labs:  Recent Labs  05/03/15 2220  WBC 16.3*  HGB 13.5  PLT 216  CREATININE 1.44*   Estimated Creatinine Clearance: 28.2 mL/min (by C-G formula based on Cr of 1.44). No results for input(s): VANCOTROUGH, VANCOPEAK, VANCORANDOM, GENTTROUGH, GENTPEAK, GENTRANDOM, TOBRATROUGH, TOBRAPEAK, TOBRARND, AMIKACINPEAK, AMIKACINTROU, AMIKACIN in the last 72 hours.   Microbiology: No results found for this or any previous visit (from the past 720 hour(s)).  Medical History: Past Medical History  Diagnosis Date  . Backache, unspecified   . Full incontinence of feces   . Mixed hyperlipidemia   . History of acute myocardial infarction   . Cystocele, midline   . Coronary atherosclerosis of unspecified type of vessel, native or graft   . Osteoporosis, unspecified 07/03/12    Hip and foremar by DEXA  . 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 Hyperuricemia     Due to HCTZ  . Unspecified urinary incontinence   . Lipoprotein deficiencies   . Unspecified cataract   . History of TIA (transient ischemic attack) 2005  . Tobacco use disorder 09/01/2007  . Other B-complex deficiencies   . Other persistent mental disorders due to conditions classified elsewhere 08-2007  . Essential hypertension, benign   . Insomnia, unspecified   .  Chronic kidney disease, stage III (moderate) 12/01/2007  . Memory deficits 11/19/2012  . Abnormality of gait 11/19/2012  . Alzheimer's dementia     stage 4    Medications:  Infusions:  . piperacillin-tazobactam 3.375 g (05/04/15 0003)  . sodium chloride 1,000 mL (05/04/15 0003)  . vancomycin     Assessment: 83 yof cc LOC after repeated episodes of diarrhea. Treating as sepsis for critically ill appearance and report from EMS, although vitals reassuring.   Vd 41.6 L, Ke 0.028 hr-1, T1/2 24.5 hr  Goal of Therapy:  Vancomycin trough level 15-20 mcg/ml  Plan:  Expected duration 5 days with resolution of temperature and/or normalization of WBC. Zosyn 3.375 gm IV Q8H EI. Pt received 1 gm vancomycin in ED, will start 750 mg IV Q24H predicted trough 18 mcg/mL. Pharmacy will continue to follow and adjust as needed to maintain trough 15 to 20 mcg/mL.  Carola FrostNathan A Rein Popov, Pharm.D., BCPS Clinical Pharmacist 05/04/2015,12:22 AM

## 2015-05-04 NOTE — H&P (Addendum)
[facility] Physicians - [place] at [facility]   PATIENT NAME: Kathleen Graham    MR#:  [id number]  DATE OF BIRTH:  02-Feb-1932  DATE OF ADMISSION:  05/03/2015  PRIMARY CARE PHYSICIAN: Ailene Ravel, MD   REQUESTING/REFERRING PHYSICIAN:   CHIEF COMPLAINT:   Chief Complaint  Patient presents with  . Loss of Consciousness    HISTORY OF PRESENT ILLNESS: Kathleen Graham  is a 81 y.o. female with a known history of Alzheimer's dementia, hyperlipidemia ,osteoporosis, coronary atherosclerosis , TIA, hypertension, chronic kidney disease stage III presented to the emergency room with confusion and passing out. Patient's family called EMS and patient was brought to the emergency room. Patient had diarrhea multiple times and vomiting since 9 PM yesterday. The diarrhea was watery stool with no blood. No history of any recent travel or any consumption of food outside the home. Patient appears weak and dehydrated. Patient is not a great historian, most of the history obtained from patient's daughter-in-law was at the bedside. According to the family after multiple episodes of diarrhea, patient felt weak and passed out in the rest room. No history of any fever.  Vomitus contained food and water particles. In the emergency room Pt is awake and responds to verbal commands.Not completely oriented.No sick contacts at home.  PAST MEDICAL HISTORY:   Past Medical History  Diagnosis Date  . Backache, unspecified   . Full incontinence of feces   . Mixed hyperlipidemia   . History of acute myocardial infarction   . Cystocele, midline   . Coronary atherosclerosis of unspecified type of vessel, native or graft   . Osteoporosis, unspecified 07/03/12    Hip and foremar by DEXA  . 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 Hyperuricemia     Due to HCTZ  . Unspecified urinary incontinence   . Lipoprotein deficiencies   . Unspecified cataract   . History of TIA (transient ischemic  attack) 2005  . Tobacco use disorder 09/01/2007  . Other B-complex deficiencies   . Other persistent mental disorders due to conditions classified elsewhere 08-2007  . Essential hypertension, benign   . Insomnia, unspecified   . Chronic kidney disease, stage III (moderate) 12/01/2007  . Memory deficits 11/19/2012  . Abnormality of gait 11/19/2012  . Alzheimer's dementia     stage 4    PAST SURGICAL HISTORY:  Past Surgical History  Procedure Laterality Date  . Cataract extraction Bilateral   . Abdominal hysterectomy      SOCIAL HISTORY:  Social History  Substance Use Topics  . Smoking status: Former Smoker    Quit date: 06/11/2011  . Smokeless tobacco: Not on file  . Alcohol Use: No    FAMILY HISTORY:  Family History  Problem Relation Age of Onset  . Heart attack Son     DRUG ALLERGIES:  Allergies  Allergen Reactions  . Hydrochlorothiazide     Hyperurlcemia  . Tramadol     Leg weakness    REVIEW OF SYSTEMS:   CONSTITUTIONAL: No fever, fatigue noted, weakness present.  EYES: No blurred or double vision.  EARS, NOSE, AND THROAT: No tinnitus or ear pain.  RESPIRATORY: No cough, shortness of breath, wheezing or hemoptysis.  CARDIOVASCULAR: No chest pain, orthopnea, edema.  GASTROINTESTINAL: nausea present, vomiting, diarrhea present, no abdominal pain.  GENITOURINARY:  Dysuria noted,no hematuria.  ENDOCRINE: No polyuria, nocturia,  HEMATOLOGY: No anemia, easy bruising or bleeding SKIN: No rash or lesion. MUSCULOSKELETAL: No joint pain or arthritis.  NEUROLOGIC: No tingling, numbness, weakness present .  PSYCHIATRY: No anxiety or depression, Has depression.   MEDICATIONS AT HOME:  Prior to Admission medications   Medication Sig Start Date End Date Taking? Authorizing Provider  albuterol (PROVENTIL HFA;VENTOLIN HFA) 108 (90 BASE) MCG/ACT inhaler Inhale 2 puffs into the lungs every 4 (four) hours as needed for wheezing or shortness of breath.   Yes Historical  Provider, MD  albuterol (PROVENTIL) (2.5 MG/3ML) 0.083% nebulizer solution Take 2.5 mg by nebulization every 6 (six) hours as needed for wheezing.   Yes Historical Provider, MD  aspirin 81 MG tablet Take 162 mg by mouth daily.    Yes Historical Provider, MD  citalopram (CELEXA) 20 MG tablet Take 20 mg by mouth daily. 11/07/12  Yes Historical Provider, MD  tiotropium (SPIRIVA) 18 MCG inhalation capsule Place 18 mcg into inhaler and inhale daily.   Yes Historical Provider, MD  vitamin B-12 (CYANOCOBALAMIN) 1000 MCG tablet Take 1,000 mcg by mouth daily.   Yes Historical Provider, MD  acetaminophen (TYLENOL) 500 MG tablet Take 2 tablets (1,000 mg total) by mouth 3 (three) times daily as needed. Patient not taking: Reported on 05/03/2015 07/10/13   Genelle Gather, MD      PHYSICAL EXAMINATION:   VITAL SIGNS: Blood pressure 149/87, pulse 84, temperature 97.4 F (36.3 C), temperature source Rectal, resp. rate 19, height 5' 5.5" (1.664 m), weight 61.236 kg (135 lb), SpO2 92 %.  GENERAL:  81 y.o.-year-old patient lying in the bed appears weak and dehydrated  EYES: Pupils equal, round, reactive to light and accommodation. No scleral icterus. Extraocular muscles intact.  HEENT: Head atraumatic, normocephalic. Oropharynx dry and nasopharynx clear.  NECK:  Supple, no jugular venous distention. No thyroid enlargement, no tenderness.  LUNGS: Normal breath sounds bilaterally, no wheezing, rales,rhonchi or crepitation. No use of accessory muscles of respiration.  CARDIOVASCULAR: S1, S2 normal. No murmurs, rubs, or gallops.  ABDOMEN: Soft, nontender, nondistended. Bowel sounds present. No organomegaly or mass.  EXTREMITIES: No pedal edema, cyanosis, or clubbing.  NEUROLOGIC: Cranial nerves II through XII are intact. Muscle strength 5/5 in all extremities. Sensation intact. Gait not checked.  PSYCHIATRIC: The patient is alert and oriented x 2, Has dementia  SKIN: No obvious rash, lesion, or ulcer.    LABORATORY PANEL:   CBC  Recent Labs Lab 05/03/15 2220  WBC 16.3*  HGB 13.5  HCT 42.5  PLT 216  MCV 91.9  MCH 29.2  MCHC 31.8*  RDW 15.1*  LYMPHSABS 1.5  MONOABS 0.9  EOSABS 0.2  BASOSABS 0.0   ------------------------------------------------------------------------------------------------------------------  Chemistries   Recent Labs Lab 05/03/15 2220  NA 142  K 4.3  CL 109  CO2 24  GLUCOSE 199*  BUN 17  CREATININE 1.44*  CALCIUM 8.8*  AST 26  ALT 11*  ALKPHOS 117  BILITOT 0.4   ------------------------------------------------------------------------------------------------------------------ estimated creatinine clearance is 28.2 mL/min (by C-G formula based on Cr of 1.44). ------------------------------------------------------------------------------------------------------------------ No results for input(s): TSH, T4TOTAL, T3FREE, THYROIDAB in the last 72 hours.  Invalid input(s): FREET3   Coagulation profile  Recent Labs Lab 05/03/15 2220  INR 1.14   ------------------------------------------------------------------------------------------------------------------- No results for input(s): DDIMER in the last 72 hours. -------------------------------------------------------------------------------------------------------------------  Cardiac Enzymes  Recent Labs Lab 05/03/15 2220  TROPONINI <0.03   ------------------------------------------------------------------------------------------------------------------ Invalid input(s): POCBNP  ---------------------------------------------------------------------------------------------------------------  Urinalysis    Component Value Date/Time   COLORURINE YELLOW* 05/03/2015 2335   COLORURINE Yellow 09/11/2014 1639   APPEARANCEUR CLOUDY* 05/03/2015 2335   APPEARANCEUR Hazy 09/11/2014 1639  LABSPEC 1.023 05/03/2015 2335   LABSPEC 1.014 09/11/2014 1639   PHURINE 5.0 05/03/2015 2335    PHURINE 6.0 09/11/2014 1639   GLUCOSEU NEGATIVE 05/03/2015 2335   GLUCOSEU Negative 09/11/2014 1639   HGBUR NEGATIVE 05/03/2015 2335   HGBUR Negative 09/11/2014 1639   BILIRUBINUR NEGATIVE 05/03/2015 2335   BILIRUBINUR Negative 09/11/2014 1639   KETONESUR NEGATIVE 05/03/2015 2335   KETONESUR Negative 09/11/2014 1639   PROTEINUR 30* 05/03/2015 2335   PROTEINUR 30 mg/dL 16/10/960404/08/2014 [id number]   UROBILINOGEN 1.0 07/07/2013 2038   NITRITE POSITIVE* 05/03/2015 2335   NITRITE Positive 09/11/2014 1639   LEUKOCYTESUR 1+* 05/03/2015 2335   LEUKOCYTESUR 3+ 09/11/2014 1639     RADIOLOGY: Dg Chest Port 1 View  05/03/2015  CLINICAL DATA:  Initial evaluation for acute sepsis. EXAM: PORTABLE CHEST 1 VIEW COMPARISON:  Prior study from 10/04/2014. FINDINGS: Cardiac silhouette is stable in size and contour common remains within normal limits. Prominent atheromatous plaque within aortic arch. Probable hiatal hernia. Lungs are hypoinflated with chronic interstitial fibrotic changes again noted. Asymmetric opacity at the left lung base again seen, which may reflect fibrotic changes or possibly pneumonia. Overall, aeration is slightly improved in this region as compared to most recent chest radiograph. No pulmonary edema. No pleural effusion or pneumothorax. No acute osseus abnormality. IMPRESSION: Reduced lung volumes with chronic interstitial fibrotic changes. While asymmetric opacity at the left lung base is similar to previous study, possible superimposed pneumonia may also be present. Electronically Signed   By: Rise MuBenjamin  McClintock M.D.   On: 05/03/2015 22:38    EKG: Orders placed or performed during the hospital encounter of 05/03/15  . EKG 12-Lead  . EKG 12-Lead  . EKG 12-Lead  . EKG 12-Lead  . EKG 12-Lead  . EKG 12-Lead    IMPRESSION AND PLAN: 1. Sepsis 2. Urinary tract infection 3 altered mental status secondary to urinary tract infection  4.dehydration  5.leukocytosis  6.Diarrhoea rule out  cdifficile colitis 7.Syncope secondary to vasovagal etiology from diarrhea and volume depletion  Management plan : 1. Aggressive IV hydration 2. Start patient on IV Zosyn antibiotic and IV Flagyl antibiotic 3. Stool for C. difficile toxin 4. Monitor electrolytes 5. Telemetric monitoring and contact isolation 6. Follow up lactate level 7. Follow blood and urine culture.  All the records are reviewed and case discussed with ED provider. Management plans discussed with the patient, family and they are in agreement.  CODE STATUS:Full    TOTAL CRITICAL CARE TIME TAKING CARE OF THIS PATIENT: 58 minutes.    Ihor AustinPavan Amada Hallisey M.D on 05/04/2015 at 1:12 AM  Between 7am to 6pm - Pager - [phone]  After 6pm go to [email or web address] - password [email or web address] [facility]  [facility]  Office  [phone]  CC: Primary care physician; Ailene RavelHAMRICK,MAURA L, MD

## 2015-05-05 ENCOUNTER — Inpatient Hospital Stay: Payer: Medicare Other

## 2015-05-05 DIAGNOSIS — Z7982 Long term (current) use of aspirin: Secondary | ICD-10-CM | POA: Diagnosis not present

## 2015-05-05 DIAGNOSIS — N183 Chronic kidney disease, stage 3 (moderate): Secondary | ICD-10-CM | POA: Diagnosis not present

## 2015-05-05 DIAGNOSIS — J690 Pneumonitis due to inhalation of food and vomit: Secondary | ICD-10-CM | POA: Diagnosis not present

## 2015-05-05 DIAGNOSIS — Z9842 Cataract extraction status, left eye: Secondary | ICD-10-CM | POA: Diagnosis not present

## 2015-05-05 DIAGNOSIS — Z9841 Cataract extraction status, right eye: Secondary | ICD-10-CM | POA: Diagnosis not present

## 2015-05-05 DIAGNOSIS — Z79899 Other long term (current) drug therapy: Secondary | ICD-10-CM | POA: Diagnosis not present

## 2015-05-05 DIAGNOSIS — I129 Hypertensive chronic kidney disease with stage 1 through stage 4 chronic kidney disease, or unspecified chronic kidney disease: Secondary | ICD-10-CM | POA: Diagnosis not present

## 2015-05-05 DIAGNOSIS — F0280 Dementia in other diseases classified elsewhere without behavioral disturbance: Secondary | ICD-10-CM | POA: Diagnosis not present

## 2015-05-05 DIAGNOSIS — Z8249 Family history of ischemic heart disease and other diseases of the circulatory system: Secondary | ICD-10-CM | POA: Diagnosis not present

## 2015-05-05 DIAGNOSIS — E782 Mixed hyperlipidemia: Secondary | ICD-10-CM | POA: Diagnosis not present

## 2015-05-05 DIAGNOSIS — N3000 Acute cystitis without hematuria: Secondary | ICD-10-CM | POA: Diagnosis not present

## 2015-05-05 DIAGNOSIS — Z87891 Personal history of nicotine dependence: Secondary | ICD-10-CM | POA: Diagnosis not present

## 2015-05-05 DIAGNOSIS — E860 Dehydration: Secondary | ICD-10-CM | POA: Diagnosis not present

## 2015-05-05 DIAGNOSIS — M810 Age-related osteoporosis without current pathological fracture: Secondary | ICD-10-CM | POA: Diagnosis not present

## 2015-05-05 DIAGNOSIS — R1310 Dysphagia, unspecified: Secondary | ICD-10-CM | POA: Diagnosis not present

## 2015-05-05 DIAGNOSIS — Z8673 Personal history of transient ischemic attack (TIA), and cerebral infarction without residual deficits: Secondary | ICD-10-CM | POA: Diagnosis not present

## 2015-05-05 DIAGNOSIS — I2510 Atherosclerotic heart disease of native coronary artery without angina pectoris: Secondary | ICD-10-CM | POA: Diagnosis not present

## 2015-05-05 DIAGNOSIS — A419 Sepsis, unspecified organism: Secondary | ICD-10-CM | POA: Diagnosis not present

## 2015-05-05 DIAGNOSIS — G309 Alzheimer's disease, unspecified: Secondary | ICD-10-CM | POA: Diagnosis not present

## 2015-05-05 DIAGNOSIS — Z90710 Acquired absence of both cervix and uterus: Secondary | ICD-10-CM | POA: Diagnosis not present

## 2015-05-05 DIAGNOSIS — G9341 Metabolic encephalopathy: Secondary | ICD-10-CM | POA: Diagnosis not present

## 2015-05-05 DIAGNOSIS — I6529 Occlusion and stenosis of unspecified carotid artery: Secondary | ICD-10-CM | POA: Diagnosis not present

## 2015-05-05 DIAGNOSIS — Z888 Allergy status to other drugs, medicaments and biological substances status: Secondary | ICD-10-CM | POA: Diagnosis not present

## 2015-05-05 DIAGNOSIS — N390 Urinary tract infection, site not specified: Secondary | ICD-10-CM | POA: Diagnosis not present

## 2015-05-05 DIAGNOSIS — E872 Acidosis: Secondary | ICD-10-CM | POA: Diagnosis not present

## 2015-05-05 LAB — BASIC METABOLIC PANEL
ANION GAP: 6 (ref 5–15)
BUN: 13 mg/dL (ref 6–20)
CALCIUM: 8.2 mg/dL — AB (ref 8.9–10.3)
CO2: 22 mmol/L (ref 22–32)
Chloride: 112 mmol/L — ABNORMAL HIGH (ref 101–111)
Creatinine, Ser: 1.17 mg/dL — ABNORMAL HIGH (ref 0.44–1.00)
GFR, EST AFRICAN AMERICAN: 49 mL/min — AB (ref >60)
GFR, EST NON AFRICAN AMERICAN: 43 mL/min — AB (ref >60)
Glucose, Bld: 89 mg/dL (ref 65–99)
POTASSIUM: 3.7 mmol/L (ref 3.5–5.1)
SODIUM: 140 mmol/L (ref 135–145)

## 2015-05-05 LAB — WBCS, STOOL: WBCs, Stool: NONE SEEN

## 2015-05-05 MED ORDER — AZITHROMYCIN 250 MG PO TABS
250.0000 mg | ORAL_TABLET | Freq: Every day | ORAL | Status: DC
  Administered 2015-05-06 – 2015-05-07 (×2): 250 mg via ORAL
  Filled 2015-05-05 (×2): qty 1

## 2015-05-05 MED ORDER — AZITHROMYCIN 250 MG PO TABS
500.0000 mg | ORAL_TABLET | Freq: Every day | ORAL | Status: AC
  Administered 2015-05-05: 16:00:00 500 mg via ORAL
  Filled 2015-05-05: qty 2

## 2015-05-05 MED ORDER — ENOXAPARIN SODIUM 40 MG/0.4ML ~~LOC~~ SOLN
40.0000 mg | Freq: Every day | SUBCUTANEOUS | Status: DC
  Administered 2015-05-06 – 2015-05-07 (×2): 40 mg via SUBCUTANEOUS
  Filled 2015-05-05 (×2): qty 0.4

## 2015-05-05 NOTE — Care Management (Addendum)
Admitted to [facility] with the diagnosis of sepsis. Lives with daughter-in-law Kathleen Graham [phone] or [phone]) and son Kathleen Graham x 5 years.  Last seen  Dr. Nathanial RancherHamrick 2 weeks ago.   Kathleen Graham is unable to answer questions. Left voice mail for daughter-in-law. Daughter-in-law called. Receives personal care services 9:00am-12 noon on Wednesday and Friday. Doesn't remember name of agency. No skilled facility. No home oxygen. Two rolling walkers and a wheelchair in the home. Daughter-in-law helps with daily activities. No falls. Appetite is good at times then other times not good. Daughter-in-law states that there is someone with [name] 24/7.  Telephone call to [facility]. There last visits were in 2015.  Gwenette GreetBrenda S Graelyn Bihl RN MSN CCM Care Management [phone]

## 2015-05-05 NOTE — Evaluation (Signed)
Physical Therapy Evaluation Patient Details Name: Kathleen Graham MRN: [id number] DOB: September 23, 1931 Today's Date: 05/05/2015   History of Present Illness  Pt is an 81 y/o female that presents after collapse at home in bathroom. Patient found to have leukocytosis, acidosis, and renal insufficiency. She is noted to have stage 3 Alzheimer's as well as fecal and bladder incontinence.   Clinical Impression  Patient has fairly advanced dementia, so PT is unable to determine living situation or previous mobility level. In this session patient physically is capable of performing limited household mobility, though she requires constant physical cuing and assistance with navigating through the room as she would run in to objects with RW and get "stuck". She does not follow verbal cues, but is able to respond to physical and tactile cues to initiate transfers and bed mobility. Given that little is known about her living scenario she would require 24 hour assistance at her current level, if this is different than her baseline would recommend skilled placement at discharge to increase her mobility level.     Follow Up Recommendations Home health PT;Supervision/Assistance - 24 hour;SNF    Equipment Recommendations       Recommendations for Other Services       Precautions / Restrictions Precautions Precautions: Fall Restrictions Weight Bearing Restrictions: No      Mobility  Bed Mobility Overal bed mobility: Needs Assistance Bed Mobility: Supine to Sit;Sit to Supine     Supine to sit: Min assist Sit to supine: Min assist   General bed mobility comments: Patient requires manual cuing as she does not respond to verbal cuing, minimal assistance required to bring her LEs off the EOB.   Transfers Overall transfer level: Needs assistance Equipment used: Rolling walker (2 wheeled) Transfers: Sit to/from Stand Sit to Stand: Min assist         General transfer comment: Patient again requires  physical cuing to stand, requiring min A x1.  Ambulation/Gait Ambulation/Gait assistance: Min assist Ambulation Distance (Feet): 20 Feet Assistive device: Rolling walker (2 wheeled) Gait Pattern/deviations: Step-through pattern;Decreased step length - right;Decreased step length - left;Wide base of support;Trunk flexed   Gait velocity interpretation: <1.8 ft/sec, indicative of risk for recurrent falls General Gait Details: Patient requires PT to physically initiate walker advancement and manuevering in the room for mobility secondary to decreased cognition.   Stairs            Wheelchair Mobility    Modified Rankin (Stroke Patients Only)       Balance Overall balance assessment: Needs assistance Sitting-balance support: No upper extremity supported Sitting balance-Leahy Scale: Good     Standing balance support: Bilateral upper extremity supported Standing balance-Leahy Scale: Fair Standing balance comment: Patient requires manual cuing for ambulation and walker advancement.                              Pertinent Vitals/Pain Pain Assessment: Faces Faces Pain Scale: No hurt    Home Living Family/patient expects to be discharged to:: Private residence Living Arrangements: Children Available Help at Discharge: Family;Available 24 hours/day Type of Home: House Home Access: Stairs to enter   [employer] of Steps: 2 Home Layout: One level Home Equipment: Walker - 2 wheels;Bedside commode Additional Comments: All information obtained from previous notes, patient unable to provide any information.     Prior Function Level of Independence: Needs assistance  Hand Dominance        Extremity/Trunk Assessment   Upper Extremity Assessment: Difficult to assess due to impaired cognition           Lower Extremity Assessment: Difficult to assess due to impaired cognition         Communication   Communication: No  difficulties  Cognition Arousal/Alertness: Awake/alert Behavior During Therapy: WFL for tasks assessed/performed Overall Cognitive Status: History of cognitive impairments - at baseline                      General Comments      Exercises        Assessment/Plan    PT Assessment Patient needs continued PT services  PT Diagnosis Difficulty walking   PT Problem List Decreased strength;Decreased balance;Decreased cognition;Decreased safety awareness;Decreased activity tolerance  PT Treatment Interventions DME instruction;Therapeutic activities;Therapeutic exercise;Gait training;Balance training   PT Goals (Current goals can be found in the Care Plan section) Acute Rehab PT Goals PT Goal Formulation: Patient unable to participate in goal setting Time For Goal Achievement: 05/19/15 Potential to Achieve Goals: Fair    Frequency Min 2X/week   Barriers to discharge   Unclear what her support status at home is.     Co-evaluation               End of Session Equipment Utilized During Treatment: Gait belt Activity Tolerance: Patient tolerated treatment well Patient left: in bed;with bed alarm set;with call bell/phone within reach           Time: 1021-1038 PT Time Calculation (min) (ACUTE ONLY): 17 min   Charges:   PT Evaluation $Initial PT Evaluation Tier I: 1 Procedure     PT G Codes:       Kerin RansomPatrick A Misk Galentine, PT, DPT    05/05/2015, 1:07 PM

## 2015-05-05 NOTE — Progress Notes (Signed)
Pharmacy Note - Enoxaparin  Patient ordered enoxaparin 30mg  SQ Q24H for VTE prophylaxis. Previously dose adjusted for CrCl < 4430ml/min  Estimated Creatinine Clearance: 35.3 mL/min (by C-G formula based on Cr of 1.17)..  Will change to enoxaparin 40mg  SQ Q24H for CrCl > 1030ml/min per anticoagulation policy  Garlon HatchetJody Saree Krogh, PharmD Clinical Pharmacist  05/05/2015 9:07 AM

## 2015-05-05 NOTE — Progress Notes (Signed)
[facility] [place] [facility]   PATIENT NAME: Kathleen Graham    MR#:  [id number]  DATE OF BIRTH:  1932-01-25  SUBJECTIVE:  CHIEF COMPLAINT:   Chief Complaint  Patient presents with  . Loss of Consciousness   The patient is a 81 year old Caucasian female with past medical history significant for history of fecal incontinence, hyperlipidemia, coronary artery disease, essential hypertension. CK D stage III Alzheimer's dementia who presents to the hospital with nausea, vomiting, diarrhea and syncopal episode at home. Unfortunately, patient is not able to provide review of systems due to dementia, but she denies any pain. Her vital signs are stable and she is in normal sinus rhythm. Chest is a revealed chronic interstitial changes, but left lung base revealed an opacity which was concerning for possible pneumonia. Labs revealed leukocytosis, acidosis, lactic, renal insufficiency , pyuria, concerning for UTI. Blood culture is pending. Stool culture is still pending, however no Campylobacter was noted, urine culture more than 100,000 colony-forming units of Gram-negative rods.  More alert today and active,  Enjoying breakfast,  Minimal verbal interaction,  demented  Review of Systems  Unable to perform ROS: dementia    VITAL SIGNS: Blood pressure 147/67, pulse 78, temperature 99.4 F (37.4 C), temperature source Oral, resp. rate 18, height 5\' 6"  (1.676 m), weight 63.73 kg (140 lb 8 oz), SpO2 94 %.  PHYSICAL EXAMINATION:   GENERAL:  81 y.o.-year-old patient lying in the bed with no acute distress. Talking to herself.  No cough observed,  Sitting upright and eating her breakfast. Comfortable EYES: Pupils equal, round, reactive to light and accommodation. No scleral icterus. Extraocular muscles intact.  HEENT: Head atraumatic, normocephalic. Oropharynx and nasopharynx clear. Dry oral mucosa NECK:  Supple, no jugular venous distention. No thyroid enlargement, no  tenderness.  LUNGS: Normal breath sounds bilaterally, no wheezing, rales,rhonchi or crepitation. No use of accessory muscles of respiration.  CARDIOVASCULAR: S1, S2 normal. No murmurs, rubs, or gallops.  ABDOMEN: Soft, nontender, nondistended. Bowel sounds present but diminished. No organomegaly or mass.  EXTREMITIES: No pedal edema, cyanosis, or clubbing.  NEUROLOGIC: Cranial nerves II through XII are grossly intact. Muscle strength , unable to evaluate. Sensation unable to evaluate. Gait not checked.  PSYCHIATRIC: The patient is alert , disoriented, minimal verbal interaction, intermittently follows commands.  SKIN: No obvious rash, lesion, or ulcer.   ORDERS/RESULTS REVIEWED:   CBC  Recent Labs Lab 05/03/15 2220 05/04/15 0727  WBC 16.3* 9.2  HGB 13.5 12.3  HCT 42.5 36.5  PLT 216 163  MCV 91.9 92.0  MCH 29.2 30.9  MCHC 31.8* 33.6  RDW 15.1* 15.0*  LYMPHSABS 1.5  --   MONOABS 0.9  --   EOSABS 0.2  --   BASOSABS 0.0  --    ------------------------------------------------------------------------------------------------------------------  Chemistries   Recent Labs Lab 05/03/15 2220 05/04/15 0727 05/05/15 0656  NA 142 142 140  K 4.3 4.4 3.7  CL 109 113* 112*  CO2 24 21* 22  GLUCOSE 199* 100* 89  BUN 17 14 13   CREATININE 1.44* 1.25* 1.17*  CALCIUM 8.8* 8.2* 8.2*  AST 26  --   --   ALT 11*  --   --   ALKPHOS 117  --   --   BILITOT 0.4  --   --    ------------------------------------------------------------------------------------------------------------------ estimated creatinine clearance is 35.3 mL/min (by C-G formula based on Cr of 1.17). ------------------------------------------------------------------------------------------------------------------ No results for input(s): TSH, T4TOTAL, T3FREE, THYROIDAB in the last 72 hours.  Invalid input(s): FREET3  Cardiac Enzymes  Recent Labs Lab 05/03/15 2220  TROPONINI <0.03    ------------------------------------------------------------------------------------------------------------------ Invalid input(s): POCBNP ---------------------------------------------------------------------------------------------------------------  RADIOLOGY: Dg Chest Port 1 View  05/03/2015  CLINICAL DATA:  Initial evaluation for acute sepsis. EXAM: PORTABLE CHEST 1 VIEW COMPARISON:  Prior study from 10/04/2014. FINDINGS: Cardiac silhouette is stable in size and contour common remains within normal limits. Prominent atheromatous plaque within aortic arch. Probable hiatal hernia. Lungs are hypoinflated with chronic interstitial fibrotic changes again noted. Asymmetric opacity at the left lung base again seen, which may reflect fibrotic changes or possibly pneumonia. Overall, aeration is slightly improved in this region as compared to most recent chest radiograph. No pulmonary edema. No pleural effusion or pneumothorax. No acute osseus abnormality. IMPRESSION: Reduced lung volumes with chronic interstitial fibrotic changes. While asymmetric opacity at the left lung base is similar to previous study, possible superimposed pneumonia may also be present. Electronically Signed   By: Rise Mu M.D.   On: 05/03/2015 22:38    EKG:  Orders placed or performed during the hospital encounter of 05/03/15  . EKG 12-Lead  . EKG 12-Lead  . EKG 12-Lead  . EKG 12-Lead  . EKG 12-Lead  . EKG 12-Lead    ASSESSMENT AND PLAN:  Principal Problem:   Sepsis (HCC) Active Problems:   UTI (urinary tract infection) 1. Sepsis due to urinary tract infection, blood cultures are  negative, continue patient on Zosyn,  Discontinue vancomycin, following urine and stool cultures 2. Acute cystitis without hematuria  due to gram-negative rods. Continue  Zosyn for now until culture results are known 3. Renal insufficiency, chronic,  some improved with IV fluid administration,  Get a renal ultrasound 4.  Syncope, likely dehydration related , Following closely , blood pressure is satisfactory. Getting carotid ultrasound,  5. Community-acquired pneumonia, continue Zosyn, get sputum cultures if possible 6. Acute gastroenteritis, blood cultures are negative , stool cultures pending. C. difficile testing not done    Management plans discussed with the patient, family and they are in agreement.   DRUG ALLERGIES:  Allergies  Allergen Reactions  . Hydrochlorothiazide     Hyperurlcemia  . Tramadol     Leg weakness    CODE STATUS:     Code Status Orders        Start     Ordered   05/04/15 0303  Full code   Continuous     05/04/15 0302      TOTAL TIME TAKING CARE OF THIS PATIENT: 40 minutes.    Katharina Caper M.D on 05/05/2015 at 12:28 PM  Between 7am to 6pm - Pager - [phone]  After 6pm go to [email or web address] - password [email or web address] [facility]  [facility]  Office  [phone]  CC: Primary care physician; Ailene Ravel, MD

## 2015-05-05 NOTE — Progress Notes (Signed)
ANTIBIOTIC CONSULT NOTE - INITIAL  Pharmacy Consult for Zosyn Indication: rule out sepsis  Allergies  Allergen Reactions  . Hydrochlorothiazide     Hyperurlcemia  . Tramadol     Leg weakness    Patient Measurements: Height: 5\' 6"  (167.6 cm) Weight: 140 lb 8 oz (63.73 kg) IBW/kg (Calculated) : 59.3 Adjusted Body Weight: 59.4 kg  Vital Signs: Temp: 98.7 F (37.1 C) (11/25 1300) Temp Source: Oral (11/25 0521) BP: 139/72 mmHg (11/25 1300) Pulse Rate: 83 (11/25 1300) Intake/Output from previous day:   Intake/Output from this shift:    Labs:  Recent Labs  05/03/15 2220 05/04/15 0727 05/05/15 0656  WBC 16.3* 9.2  --   HGB 13.5 12.3  --   PLT 216 163  --   CREATININE 1.44* 1.25* 1.17*   Estimated Creatinine Clearance: 35.3 mL/min (by C-G formula based on Cr of 1.17). No results for input(s): VANCOTROUGH, VANCOPEAK, VANCORANDOM, GENTTROUGH, GENTPEAK, GENTRANDOM, TOBRATROUGH, TOBRAPEAK, TOBRARND, AMIKACINPEAK, AMIKACINTROU, AMIKACIN in the last 72 hours.   Microbiology: Recent Results (from the past 720 hour(s))  Blood Culture (routine x 2)     Status: None (Preliminary result)   Collection Time: 05/03/15 10:20 PM  Result Value Ref Range Status   Specimen Description BLOOD RIGHT ASSIST CONTROL  Final   Special Requests BOTTLES DRAWN AEROBIC AND ANAEROBIC 4CC  Final   Culture NO GROWTH 2 DAYS  Final   Report Status PENDING  Incomplete  Blood Culture (routine x 2)     Status: None (Preliminary result)   Collection Time: 05/03/15 10:20 PM  Result Value Ref Range Status   Specimen Description BLOOD LEFT ASSIST CONTROL  Final   Special Requests BOTTLES DRAWN AEROBIC AND ANAEROBIC 4CC  Final   Culture NO GROWTH 2 DAYS  Final   Report Status PENDING  Incomplete  Urine culture     Status: None (Preliminary result)   Collection Time: 05/03/15 11:35 PM  Result Value Ref Range Status   Specimen Description URINE, RANDOM  Final   Special Requests NONE  Final   Culture    Final    >=100,000 COLONIES/mL GRAM NEGATIVE RODS IDENTIFICATION AND SUSCEPTIBILITIES TO FOLLOW    Report Status PENDING  Incomplete  Stool culture     Status: None (Preliminary result)   Collection Time: 05/04/15  6:03 PM  Result Value Ref Range Status   Specimen Description STOOL  Final   Special Requests Normal  Final   Culture   Final    HOLDING FOR POSSIBLE PATHOGEN NO CAMPYLOBACTER DETECTED No Pathogenic E. coli detected    Report Status PENDING  Incomplete   Assessment: Pharmacy consulted to dose vancomycin and zosyn for sepsis, UTI, CAP in this 81 year old female. Vancomycin discontinued 11/25. Added azithromycin for atypical coverage. Deescalate based on cultures.   Plan:  Continue zosyn 3.375gm IV Q8H extended infusion, which is appropriate for renal function   Sharnice Bosler C, Pharm.D. Clinical Pharmacist 05/05/2015,1:20 PM

## 2015-05-05 NOTE — Plan of Care (Signed)
Problem: Skin Integrity: Goal: Risk for impaired skin integrity will decrease Outcome: Progressing Patient eating well today, alert to person disoriented to everything else, continues to be treated for UTI and possible PNA, went for renal US today. Stool sample sent off for cdiff culture and stool culture earlier today no results yet.  VSS

## 2015-05-05 NOTE — Progress Notes (Addendum)
Initial Nutrition Assessment   INTERVENTION:   Meals and Snacks: Cater to patient preferences Medical Food Supplement Therapy: will recommend Mighty Shakes on meal trays TID for added nutrition (each shake provides 300kcals and 9g protein)   NUTRITION DIAGNOSIS:   Inadequate oral intake related to inability to eat as evidenced by meal completion < 50%.  GOAL:   Patient will meet greater than or equal to 90% of their needs  MONITOR:    (Energy Intake, Digestive System, Electrolyte and renal Profile, Anthropometrics)  REASON FOR ASSESSMENT:   Malnutrition Screening Tool    ASSESSMENT:   Pt admitted with sepsis secondary to UTI. Pt with h/o CKD stage III and Alzheimers dementia. Pt confused on visit. Pt currently on isolation.  Past Medical History  Diagnosis Date  . Backache, unspecified   . Full incontinence of feces   . Mixed hyperlipidemia   . History of acute myocardial infarction   . Cystocele, midline   . Coronary atherosclerosis of unspecified type of vessel, native or graft   . Osteoporosis, unspecified 07/03/12    Hip and foremar by DEXA  . 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 Hyperuricemia     Due to HCTZ  . Unspecified urinary incontinence   . Lipoprotein deficiencies   . Unspecified cataract   . History of TIA (transient ischemic attack) 2005  . Tobacco use disorder 09/01/2007  . Other B-complex deficiencies   . Other persistent mental disorders due to conditions classified elsewhere 08-2007  . Essential hypertension, benign   . Insomnia, unspecified   . Chronic kidney disease, stage III (moderate) 12/01/2007  . Memory deficits 11/19/2012  . Abnormality of gait 11/19/2012  . Alzheimer's dementia     stage 4     Diet Order:  Diet regular Room service appropriate?: Yes; Fluid consistency:: Thin    Current Nutrition: Pt very confused on visit. Per Mardella Layman, RN pt ate 50% of breakfast this am without difficulty, tolerated well.  Recorded po intake 35-50% of meals yesterday.  Food/Nutrition-Related History: Pt unable to clarify po intake PTA on visit, per MST decreased appetite PTA.   Scheduled Medications:  . aspirin  162 mg Oral Daily  . citalopram  20 mg Oral Daily  . [START ON 05/06/2015] enoxaparin (LOVENOX) injection  40 mg Subcutaneous Daily  . piperacillin-tazobactam (ZOSYN)  IV  3.375 g Intravenous 3 times per day  . sodium chloride  3 mL Intravenous Q12H  . tiotropium  18 mcg Inhalation Daily  . vitamin B-12  1,000 mcg Oral Daily    Continuous Medications:  . sodium chloride 100 mL/hr at 05/05/15 1014     Electrolyte/Renal Profile and Glucose Profile:   Recent Labs Lab 05/03/15 2220 05/04/15 0727 05/05/15 0656  NA 142 142 140  K 4.3 4.4 3.7  CL 109 113* 112*  CO2 24 21* 22  BUN CREATININE 1.44* 1.25* 1.17*  CALCIUM 8.8* 8.2* 8.2*  GLUCOSE 199* 100* 89   Protein Profile:  Recent Labs Lab 05/03/15 2220  ALBUMIN 3.1*    Gastrointestinal Profile: Last BM:  05/05/2015   Nutrition-Focused Physical Exam Findings:  Unable to complete Nutrition-Focused physical exam at this time.    Weight Change: Per CHL weight loss of 6% in almost one year   Skin:  Reviewed, no issues   Height:   Ht Readings from Last 1 Encounters:  05/04/15  (1.676 m)    Weight:   Wt Readings from Last  1 Encounters:  05/04/15 140 lb 8 oz (63.73 kg)   Wt Readings from Last 10 Encounters:  05/04/15 140 lb 8 oz (63.73 kg)  07/07/13 149 lb (67.586 kg)  11/19/12 138 lb (62.596 kg)     BMI:  Body mass index is 22.69 kg/(m^2).  Estimated Nutritional Needs:   Kcal:  BEE: 1119kcals, TEE: (IF 1.1-1.3)(AF 1.2) 1476-1745kcals  Protein:  51-64g protein (0.8-1.0g/kg)  Fluid:  1593-195811mL o fluid (25-3630mL/kg)  EDUCATION NEEDS:   Education needs no appropriate at this time   MODERATE Care Level   Leda QuailAllyson Vinnie Gombert, RD, LDN Pager [phone]

## 2015-05-05 NOTE — Plan of Care (Signed)
Problem: Education: Goal: Knowledge of [facility] Education information/materials will improve Outcome: Progressing Pt is alert to self, reoriented to location, situation and time. Hourly rounding.  Problem: Safety: Goal: Ability to remain free from injury will improve Outcome: Progressing Patient is a high fall risk.  Patient continually reoriented and educated about calling for assistance. Patient room across from nurses station for safety, bed in lowest position with wheels locked and call bell within reach.     Problem: Pain Managment: Goal: General experience of comfort will improve Outcome: Progressing No signs or symptoms of distress at this time.  Patient free from pain using FACES scale.  Problem: Bowel/Gastric: Goal: Will not experience complications related to bowel motility Outcome: Progressing Pt is on enteric precautions for r/o c-diff. No stools this shift.

## 2015-05-05 NOTE — Care Management Important Message (Signed)
Important Message  Patient Details  Name: Kathleen Graham MRN: [id number] Date of Birth: Sep 21, 1931   Medicare Important Message Given:  Yes    Gwenette GreetBrenda S Wissam Resor, RN 05/05/2015, 10:27 AM

## 2015-05-06 LAB — URINE CULTURE: Culture: >=100000

## 2015-05-06 LAB — STOOL CULTURE: SPECIAL REQUESTS: NORMAL

## 2015-05-06 NOTE — Plan of Care (Signed)
Problem: Urinary Elimination: Goal: Signs and symptoms of infection will decrease Outcome: Progressing  alert to person otherwise disoriented , continues to be treated for UTI and possible PNA, taken off enteric precautions stool soft lab unable to test for cdiff, VSS labs pending

## 2015-05-06 NOTE — Plan of Care (Signed)
Problem: Education: Goal: Knowledge of [facility] Education information/materials will improve Outcome: Progressing Education to family regarding  Feeding upright and making sure pt clears mouth .  Problem: Safety: Goal: Ability to remain free from injury will improve Outcome: Progressing Free from injury this shift. siderails  Up x2 and bed alarm in use. Room free from clutter  Problem: Pain Managment: Goal: General experience of comfort will improve Outcome: Progressing Denies pain this shift. Resting quietly. Repos  Side to back to side.tol well  Problem: Skin Integrity: Goal: Risk for impaired skin integrity will decrease Outcome: Progressing Eccy. D/t frail skin. Pressure areas releived with pillows  Problem: Activity: Goal: Risk for activity intolerance will decrease Outcome: Progressing See above note  Problem: Urinary Elimination: Goal: Signs and symptoms of infection will decrease Outcome: Progressing Pt incont freq. Changed freq  To prevent skin breakdown. No  Breakdown noted. abx cont for infection and  ivfs cont. No further s/s of syncope . As at admission. md  Reports this was related to dehydration. Speech saw today and recommedned dysphagia 1 diet with nectar thick liquids. Pt seem to tol  This diet today. Seem to eat better today.

## 2015-05-06 NOTE — Progress Notes (Signed)
[facility] [place] [facility]   PATIENT NAME: Kathleen Graham    MR#:  [id number]  DATE OF BIRTH:  27-Jun-1931  SUBJECTIVE:  CHIEF COMPLAINT:   Chief Complaint  Patient presents with  . Loss of Consciousness   The patient is a 81 year old Caucasian female with past medical history significant for history of fecal incontinence, hyperlipidemia, coronary artery disease, essential hypertension. CK D stage III Alzheimer's dementia who presents to the hospital with nausea, vomiting, diarrhea and syncopal episode at home. Unfortunately, patient is not able to provide review of systems due to dementia, but she denies any pain. Her vital signs are stable and she is in normal sinus rhythm. Chest is a revealed chronic interstitial changes, but left lung base revealed an opacity which was concerning for possible pneumonia. Labs revealed leukocytosis, acidosis, lactic, renal insufficiency , pyuria, concerning for UTI. Blood cultures are negative. Stool culture unremarkable, however no Campylobacter or pathogenic Escherichia coli was noted, urine culture more than 100,000 colony-forming units of Gram-negative rods, results are still pending.  More alert today and interactive, but still minimal verbal response, oral intake is poor, changing to dysphagia 1 diet today Review of Systems  Unable to perform ROS: dementia    VITAL SIGNS: Blood pressure 131/55, pulse 72, temperature 99.1 F (37.3 C), temperature source Oral, resp. rate 18, height  (1.676 m), weight 63.73 kg (140 lb 8 oz), SpO2 94 %.  PHYSICAL EXAMINATION:   GENERAL:  81 y.o.-year-old patient lying in the bed with no acute distress. Mumbling to herself.  No cough observed,  curled in bed  EYES: Pupils equal, round, reactive to light and accommodation. No scleral icterus. Extraocular muscles intact.  HEENT: Head atraumatic, normocephalic. Oropharynx and nasopharynx clear. Dry oral mucosa NECK:  Supple, no jugular  venous distention. No thyroid enlargement, no tenderness.  LUNGS: Normal breath sounds bilaterally, no wheezing, rales,rhonchi or crepitation. No use of accessory muscles of respiration.  CARDIOVASCULAR: S1, S2 normal. No murmurs, rubs, or gallops.  ABDOMEN: Soft, nontender, nondistended. Bowel sounds present but diminished. No organomegaly or mass.  EXTREMITIES: No pedal edema, cyanosis, or clubbing.  NEUROLOGIC: Cranial nerves II through XII are grossly intact. Muscle strength , unable to evaluate. Sensation unable to evaluate. Gait not checked.  PSYCHIATRIC: The patient is alert , disoriented, minimal verbal interaction, intermittently follows commands.  SKIN: No obvious rash, lesion, or ulcer.   ORDERS/RESULTS REVIEWED:   CBC  Recent Labs Lab 05/03/15 2220 05/04/15 0727  WBC 16.3* 9.2  HGB 13.5 12.3  HCT 42.5 36.5  PLT 216 163  MCV 91.9 92.0  MCH 29.2 30.9  MCHC 31.8* 33.6  RDW 15.1* 15.0*  LYMPHSABS 1.5  --   MONOABS 0.9  --   EOSABS 0.2  --   BASOSABS 0.0  --    ------------------------------------------------------------------------------------------------------------------  Chemistries   Recent Labs Lab 05/03/15 2220 05/04/15 0727 05/05/15 0656  NA 142 142 140  K 4.3 4.4 3.7  CL 109 113* 112*  CO2 24 21* 22  GLUCOSE 199* 100* 89  BUN CREATININE 1.44* 1.25* 1.17*  CALCIUM 8.8* 8.2* 8.2*  AST 26  --   --   ALT 11*  --   --   ALKPHOS 117  --   --   BILITOT 0.4  --   --    ------------------------------------------------------------------------------------------------------------------ estimated creatinine clearance is 35.3 mL/min (by C-G formula based on Cr of 1.17). ------------------------------------------------------------------------------------------------------------------ No results for input(s): TSH, T4TOTAL,  T3FREE, THYROIDAB in the last 72 hours.  Invalid input(s): FREET3  Cardiac Enzymes  Recent Labs Lab 05/03/15 2220   TROPONINI <0.03   ------------------------------------------------------------------------------------------------------------------ Invalid input(s): POCBNP ---------------------------------------------------------------------------------------------------------------  RADIOLOGY: [place] Renal  05/05/2015  CLINICAL DATA:  Renal insufficiency. EXAM: RENAL / URINARY TRACT ULTRASOUND COMPLETE COMPARISON:  None. FINDINGS: Right Kidney: Length: 8.1 cm. Echogenicity within normal limits. No mass or hydronephrosis visualized. Left Kidney: Length: 9.4 cm. Echogenicity within normal limits. No mass or hydronephrosis visualized. Bladder: Lateral wall is mildly thickened to 8 mm. The bladder is partially distended. IMPRESSION: 1. Normal kidneys without hydronephrosis. 2. Thickened bladder wall may represent nondistended state versus bladder outlet obstruction or cystitis. Electronically Signed   By: Genevive BiStewart  Edmunds M.D.   On: 05/05/2015 15:42   [place] Carotid Bilateral  05/05/2015  CLINICAL DATA:  Stroke EXAM: BILATERAL CAROTID DUPLEX ULTRASOUND TECHNIQUE: Wallace CullensGray scale imaging, color Doppler and duplex ultrasound was performed of bilateral carotid and vertebral arteries in the neck. COMPARISON:  CT 07/02/2011 previous REVIEW OF SYSTEMS: Quantification of carotid stenosis is based on velocity parameters that correlate the residual internal carotid diameter with NASCET-based stenosis levels, using the diameter of the distal internal carotid lumen as the denominator for stenosis measurement. The following velocity measurements were obtained: PEAK SYSTOLIC/END DIASTOLIC RIGHT ICA:  Occluded CCA:                     50/6cm/sec SYSTOLIC ICA/CCA RATIO:  Not applicable DIASTOLIC ICA/CCA RATIO: Not applicable ECA:                     105cm/sec LEFT ICA:                     65/14cm/sec CCA:                     64/11cm/sec SYSTOLIC ICA/CCA RATIO:  1.0 DIASTOLIC ICA/CCA RATIO: 1.2 ECA:                     113cm/sec FINDINGS: RIGHT  CAROTID ARTERY: High resistance waveform in the common carotid artery. Calcified plaque in the bulb. Minimal plaque at the external carotid origin without significant stenosis. Occlusion of the ICA just beyond its origin with no color or Doppler flow signal. No evidence of string sign on color or power Doppler. RIGHT VERTEBRAL ARTERY:  Normal flow direction and waveform. LEFT CAROTID ARTERY: Eccentric partially calcified plaque in the distal common carotid artery and bulb resulting in mild stenosis. Normal waveforms and color Doppler signal. Distal ICA tortuous. LEFT VERTEBRAL ARTERY: Normal flow direction and waveform. IMPRESSION: 1. Origin occlusion of the right ICA without evidence of string sign. 2. Mild left carotid bifurcation plaque resulting in less than 50% diameter stenosis. 3. Antegrade vertebral arterial flow bilaterally. Electronically Signed   By: Corlis Leak  Hassell M.D.   On: 05/05/2015 15:59    EKG:  Orders placed or performed during the hospital encounter of 05/03/15  . EKG 12-Lead  . EKG 12-Lead  . EKG 12-Lead  . EKG 12-Lead  . EKG 12-Lead  . EKG 12-Lead    ASSESSMENT AND PLAN:  Principal Problem:   Sepsis (HCC) Active Problems:   UTI (urinary tract infection) 1. Sepsis due to urinary tract infection, blood cultures are  negative, continue patient on Zosyn,  Off vancomycin, following urine culture , stool, blood cultures are unremarkable so far 2. Acute cystitis without hematuria  due to gram-negative rods. Continue  Zosyn for now until culture results are known 3. Renal insufficiency, chronic,  some improved with IV fluid administration,  renal ultrasound revealed no hydronephrosis and normal kidneys but thickened bladder wall 4. Syncope, suspected dehydration related , no recurrences , blood pressure is satisfactory and should be kept at the higher level since patient does have right ICA occlusion just beyond its origin on carotid ultrasound, mild left carotid bifurcation plaque,  which is less than 50% stenosis, antegrade vertebral arterial flow bilaterally were also noted  5. Community-acquired pneumonia, continue Zosyn, Zithromax, unable to get sputum cultures, repeat chest x-ray tomorrow morning 6. Acute gastroenteritis, blood cultures are negative , stool cultures unremarkable, . C. difficile testing not done , pending 7. Metabolic Encephalopathy due to infection in the setting of severe dementia, supportive care, get speech therapist involved in initiate patient on dysphagia 1 diet   Management plans discussed with the patient, family and they are in agreement.   DRUG ALLERGIES:  Allergies  Allergen Reactions  . Hydrochlorothiazide     Hyperurlcemia  . Tramadol     Leg weakness    CODE STATUS:     Code Status Orders        Start     Ordered   05/04/15 0303  Full code   Continuous     05/04/15 0302      TOTAL TIME TAKING CARE OF THIS PATIENT: 35 minutes.    Katharina Caper M.D on 05/06/2015 at 10:56 AM  Between 7am to 6pm - Pager - [phone]  After 6pm go to [email or web address] - password [email or web address] [facility]  [facility]  Office  [phone]  CC: Primary care physician; Ailene Ravel, MD

## 2015-05-06 NOTE — Evaluation (Signed)
Clinical/Bedside Swallow Evaluation Patient Details  Name: Kathleen Graham L Tutterow MRN: [id number] Date of Birth: 03-Feb-1932  Today's Date: 05/06/2015 Time: SLP Start Time (ACUTE ONLY): 1130 SLP Stop Time (ACUTE ONLY): 1210 SLP Time Calculation (min) (ACUTE ONLY): 40 min  Past Medical History:  Past Medical History  Diagnosis Date  . Backache, unspecified   . Full incontinence of feces   . Mixed hyperlipidemia   . History of acute myocardial infarction   . Cystocele, midline   . Coronary atherosclerosis of unspecified type of vessel, native or graft   . Osteoporosis, unspecified 07/03/12    Hip and foremar by DEXA  . 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 Hyperuricemia     Due to HCTZ  . Unspecified urinary incontinence   . Lipoprotein deficiencies   . Unspecified cataract   . History of TIA (transient ischemic attack) 2005  . Tobacco use disorder 09/01/2007  . Other B-complex deficiencies   . Other persistent mental disorders due to conditions classified elsewhere 08-2007  . Essential hypertension, benign   . Insomnia, unspecified   . Chronic kidney disease, stage III (moderate) 12/01/2007  . Memory deficits 11/19/2012  . Abnormality of gait 11/19/2012  . Alzheimer's dementia     stage 4   Past Surgical History:  Past Surgical History  Procedure Laterality Date  . Cataract extraction Bilateral   . Abdominal hysterectomy     HPI:      Assessment / Plan / Recommendation Clinical Impression  pt presents with a moderate to severe oral pharyngeal dysphagia characterized by a cough and throat clear with ice chips and thin liquids. pt was ntoed to have poor bolus control and formation as well as increased a-p transit during intake of puree trials. Solids were not tested as formation with pree was decreased and family report of pt removing foods that are not pureed. diet recommendations discussed with family, nursing and md.     Aspiration Risk       Diet  Recommendation Dysphagia 1 (Puree);Nectar-thick liquid   Liquid Administration via: Cup;No straw Medication Administration: Crushed with puree Supervision: Staff to assist with self feeding Compensations: Slow rate;Small sips/bites;Follow solids with liquid Postural Changes: Seated upright at 90 degrees    Other  Recommendations Oral Care Recommendations: Oral care BID   Follow up Recommendations       Frequency and Duration min 3x week  1 week       Prognosis Prognosis for Safe Diet Advancement: Fair Barriers to Reach Goals: Cognitive deficits      Swallow Study   General Date of Onset: 05/06/15 Type of Study: Bedside Swallow Evaluation Diet Prior to this Study: Regular;Thin liquids Temperature Spikes Noted: N/A Respiratory Status: Room air Behavior/Cognition: Cooperative;Confused Oral Cavity Assessment: Within Functional Limits Oral Care Completed by SLP: No Oral Cavity - Dentition: Dentures, top Vision: Functional for self-feeding Self-Feeding Abilities: Needs assist Patient Positioning: Upright in bed    Oral/Motor/Sensory Function Overall Oral Motor/Sensory Function: Mild impairment   Ice Chips Ice chips: Impaired Oral Phase Impairments: Impaired mastication;Reduced lingual movement/coordination;Reduced labial seal Oral Phase Functional Implications: Oral holding;Prolonged oral transit Pharyngeal Phase Impairments: Cough - Immediate;Cough - Delayed;Throat Clearing - Immediate   Thin Liquid Thin Liquid: Impaired Presentation: Cup;Spoon Pharyngeal  Phase Impairments: Throat Clearing - Delayed;Cough - Immediate;Cough - Delayed;Throat Clearing - Immediate;Wet Vocal Quality    Nectar Thick Nectar Thick Liquid: Within functional limits Presentation: Cup;Spoon   Honey Thick Honey Thick Liquid: Within functional limits Presentation:  Cup   Puree Puree: Within functional limits Presentation: Spoon   Solid Solid: Not tested Other Comments: family states she holds solids  and moves around oral cavity/ spits out       [employer] 05/06/2015,12:55 PM

## 2015-05-07 ENCOUNTER — Inpatient Hospital Stay: Payer: Medicare Other

## 2015-05-07 DIAGNOSIS — N183 Chronic kidney disease, stage 3 unspecified: Secondary | ICD-10-CM

## 2015-05-07 DIAGNOSIS — J189 Pneumonia, unspecified organism: Secondary | ICD-10-CM

## 2015-05-07 DIAGNOSIS — J181 Lobar pneumonia, unspecified organism: Secondary | ICD-10-CM

## 2015-05-07 DIAGNOSIS — I6529 Occlusion and stenosis of unspecified carotid artery: Secondary | ICD-10-CM

## 2015-05-07 DIAGNOSIS — R1310 Dysphagia, unspecified: Secondary | ICD-10-CM

## 2015-05-07 MED ORDER — AZITHROMYCIN 250 MG PO TABS: 250.0000 mg | ORAL_TABLET | Freq: Every day | ORAL | Status: DC

## 2015-05-07 MED ORDER — CEFDINIR 300 MG PO CAPS: 300.0000 mg | ORAL_CAPSULE | Freq: Two times a day (BID) | ORAL | Status: DC

## 2015-05-07 NOTE — Plan of Care (Signed)
Problem: Safety: Goal: Ability to remain free from injury will improve Outcome: Progressing Patient remains high fall risk, bed alarm in place, room near nursuing station,   Problem: Pain Managment: Goal: General experience of comfort will improve Outcome: Progressing Patient resting comfortably durning shift, repostioned as needed   Problem: Skin Integrity: Goal: Risk for impaired skin integrity will decrease Outcome: Progressing Pt changed frequently thoughout the night due to risk for skin breakdown, bottom red and blanchable, pink foam in place   Problem: Urinary Elimination: Goal: Signs and symptoms of infection will decrease Uneventful shift, pt resting comfortably, continues IV zosyn

## 2015-05-07 NOTE — Care Management Note (Signed)
Case Management Note  Patient Details  Name: Lowella Gripellie L Sporer MRN: [id number] Date of Birth: Oct 08, 1931  Subjective/Objective:  [facility] Candice. Verbal report given to Candice who reported that start of care would be either Monday or Tuesday. Candice will call back after she verifies that daughter-in-law's address is within the catchment area [facility]                    Action/Plan:   Expected Discharge Date:                  Expected Discharge Plan:     In-House Referral:     Discharge planning Services     Post Acute Care Choice:    Choice offered to:     DME Arranged:    DME Agency:     HH Arranged:    HH Agency:     Status of Service:     Medicare Important Message Given:  Yes Date Medicare IM Given:    Medicare IM give by:    Date Additional Medicare IM Given:    Additional Medicare Important Message give by:     If discussed at Long Length of Stay Meetings, dates discussed:    Additional Comments:  Tazaria Dlugosz A, RN 05/07/2015, 1:37 PM

## 2015-05-07 NOTE — Discharge Summary (Signed)
[facility] [place] at [facility]   PATIENT NAME: Kathleen Graham    MR#:  [id number]  DATE OF BIRTH:  05-16-1932  DATE OF ADMISSION:  05/03/2015 ADMITTING PHYSICIAN: Ihor Austin, MD  DATE OF DISCHARGE: No discharge date for patient encounter.  PRIMARY CARE PHYSICIAN: HAMRICK,MAURA L, MD     ADMISSION DIAGNOSIS:  Syncope and collapse [R55] UTI (lower urinary tract infection) [N39.0] Severe sepsis (HCC) [A41.9, R65.20] Diarrhea, unspecified type [R19.7]  DISCHARGE DIAGNOSIS:  Principal Problem:   Sepsis (HCC) Active Problems:   UTI (urinary tract infection)   Left lower lobe pneumonia   CKD (chronic kidney disease), stage III   ICAO (internal carotid artery occlusion)   Dysphagia   SECONDARY DIAGNOSIS:   Past Medical History  Diagnosis Date  . Backache, unspecified   . Full incontinence of feces   . Mixed hyperlipidemia   . History of acute myocardial infarction   . Cystocele, midline   . Coronary atherosclerosis of unspecified type of vessel, native or graft   . Osteoporosis, unspecified 07/03/12    Hip and foremar by DEXA  . 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 Hyperuricemia     Due to HCTZ  . Unspecified urinary incontinence   . Lipoprotein deficiencies   . Unspecified cataract   . History of TIA (transient ischemic attack) 2005  . Tobacco use disorder 09/01/2007  . Other B-complex deficiencies   . Other persistent mental disorders due to conditions classified elsewhere 08-2007  . Essential hypertension, benign   . Insomnia, unspecified   . Chronic kidney disease, stage III (moderate) 12/01/2007  . Memory deficits 11/19/2012  . Abnormality of gait 11/19/2012  . Alzheimer's dementia     stage 4    .pro HOSPITAL COURSE:  The patient is a 81 year old Caucasian female with past medical history significant for history of fecal incontinence, hyperlipidemia, coronary artery disease, essential hypertension. CK D  stage III Alzheimer's dementia who presents to the hospital with nausea, vomiting, diarrhea and syncopal episode at home.  Her vital signs were stable and she was in normal sinus rhythm. Chest is a revealed chronic interstitial changes, but left lung base revealed an opacity which was concerning for possible pneumonia. Labs revealed leukocytosis, lactic acidosis,  renal insufficiency , pyuria, concerning for UTI. Blood cultures were taken and they were negative. Stool culture unremarkable, no Campylobacter or pathogenic Escherichia coli was noted, urine culture more than 100,000 colony-forming units of Escherichia coli, resistant to ampicillin, ciprofloxacin and ofloxacin, sensitive to all other antibiotics. Patient was initially poorly responsive, however, as time progressed with IV as well as antibiotic therapy she improved. She was evaluated by speech therapist and recommended dysphagia 1 diet with nectar thick liquids . She was seen by physical therapist and recommended home health physical therapy with 24-hour assistance. She was felt to be stable to be discharged home on antibiotic therapy today 27th of November Discussion by problem 1. Sepsis due to urinary tract infection as well as likely left lower lobe pneumonia, blood cultures are negative, continue patient on Zithromax and Cefdinir orally for 6 more days to complete 10 day course, urine culture revealed Escherichia coli sensitive to cefdinir, stool, blood cultures were unremarkable, sputum cultures, unfortunately were not obtained 2. Acute cystitis without hematuria due to Escherichia coli. Continue Cefdinir for 6 more days to complete 10 day course 3. Renal insufficiency, chronic, some improved with IV fluid administration, renal ultrasound revealed no hydronephrosis and normal kidneys  but thickened bladder wall, consistent with cystitis 4. Syncope, suspected dehydration, sepsis, infection as well as right ICA occlusion related , no  recurrences , blood pressure is satisfactory and should be kept at the higher level at around 140 systolic since patient does have right ICA occlusion just beyond its origin on carotid ultrasound, mild left carotid bifurcation plaque, which is less than 50% stenosis, antegrade vertebral arterial flow . Patient received IV fluids while in the hospital and improved clinically  5. Community-acquired pneumonia, continue Cefdinir and  Zithromax orally for 6 more days as above, unable to get sputum cultures, repeated chest x-ray revealed pneumonia, still possible that patient had aspiration pneumonitis, following clinically. Follow-up with primary care physician as outpatient 6. Acute gastroenteritis, blood cultures are negative , stool cultures unremarkable, . C. difficile testing not done , diarrhea has resolved 7. Metabolic Encephalopathy due to infection in the setting of severe dementia, supportive care, appreciate physical and speech therapist input, now patient is on dysphagia 1 diet with nectar thick liquids, continue home health services at home  DISCHARGE CONDITIONS:   Stable  CONSULTS OBTAINED:     DRUG ALLERGIES:   Allergies  Allergen Reactions  . Hydrochlorothiazide     Hyperurlcemia  . Tramadol     Leg weakness    DISCHARGE MEDICATIONS:   Current Discharge Medication List    START taking these medications   Details  azithromycin (ZITHROMAX) 250 MG tablet Take 1 tablet (250 mg total) by mouth daily. Qty: 6 each, Refills: 0    cefdinir (OMNICEF) 300 MG capsule Take 1 capsule (300 mg total) by mouth 2 (two) times daily. Qty: 12 capsule, Refills: 0      CONTINUE these medications which have NOT CHANGED   Details  albuterol (PROVENTIL HFA;VENTOLIN HFA) 108 (90 BASE) MCG/ACT inhaler Inhale 2 puffs into the lungs every 4 (four) hours as needed for wheezing or shortness of breath.    albuterol (PROVENTIL) (2.5 MG/3ML) 0.083% nebulizer solution Take 2.5 mg by nebulization every  6 (six) hours as needed for wheezing.    aspirin 81 MG tablet Take 162 mg by mouth daily.     citalopram (CELEXA) 20 MG tablet Take 20 mg by mouth daily.    tiotropium (SPIRIVA) 18 MCG inhalation capsule Place 18 mcg into inhaler and inhale daily.    vitamin B-12 (CYANOCOBALAMIN) 1000 MCG tablet Take 1,000 mcg by mouth daily.    acetaminophen (TYLENOL) 500 MG tablet Take 2 tablets (1,000 mg total) by mouth 3 (three) times daily as needed. Refills: 0         DISCHARGE INSTRUCTIONS:    Patient is to follow-up with primary care physician as outpatient  If you experience worsening of your admission symptoms, develop shortness of breath, life threatening emergency, suicidal or homicidal thoughts you must seek medical attention immediately by calling 911 or calling your MD immediately  if symptoms less severe.  You Must read complete instructions/literature along with all the possible adverse reactions/side effects for all the Medicines you take and that have been prescribed to you. Take any new Medicines after you have completely understood and accept all the possible adverse reactions/side effects.   Please note  You were cared for by a hospitalist during your hospital stay. If you have any questions about your discharge medications or the care you received while you were in the hospital after you are discharged, you can call the unit and asked to speak with the hospitalist on call if the hospitalist  that took care of you is not available. Once you are discharged, your primary care physician will handle any further medical issues. Please note that NO REFILLS for any discharge medications will be authorized once you are discharged, as it is imperative that you return to your primary care physician (or establish a relationship with a primary care physician if you do not have one) for your aftercare needs so that they can reassess your need for medications and monitor your lab  values.    Today   CHIEF COMPLAINT:   Chief Complaint  Patient presents with  . Loss of Consciousness    HISTORY OF PRESENT ILLNESS:  Aarian Cleaver  is a 81 y.o. female with a known history of fecal incontinence, hyperlipidemia, coronary artery disease, essential hypertension. CK D stage III Alzheimer's dementia who presents to the hospital with nausea, vomiting, diarrhea and syncopal episode at home.  Her vital signs were stable and she was in normal sinus rhythm. Chest is a revealed chronic interstitial changes, but left lung base revealed an opacity which was concerning for possible pneumonia. Labs revealed leukocytosis, lactic acidosis,  renal insufficiency , pyuria, concerning for UTI. Blood cultures were taken and they were negative. Stool culture unremarkable, no Campylobacter or pathogenic Escherichia coli was noted, urine culture more than 100,000 colony-forming units of Escherichia coli, resistant to ampicillin, ciprofloxacin and ofloxacin, sensitive to all other antibiotics. Patient was initially poorly responsive, however, as time progressed with IV as well as antibiotic therapy she improved. She was evaluated by speech therapist and recommended dysphagia 1 diet with nectar thick liquids . She was seen by physical therapist and recommended home health physical therapy with 24-hour assistance. She was felt to be stable to be discharged home on antibiotic therapy today 27th of November Discussion by problem 1. Sepsis due to urinary tract infection as well as likely left lower lobe pneumonia, blood cultures are negative, continue patient on Zithromax and Cefdinir orally for 6 more days to complete 10 day course, urine culture revealed Escherichia coli sensitive to cefdinir, stool, blood cultures were unremarkable, sputum cultures, unfortunately were not obtained 2. Acute cystitis without hematuria due to Escherichia coli. Continue Cefdinir for 6 more days to complete 10 day course 3.  Renal insufficiency, chronic, some improved with IV fluid administration, renal ultrasound revealed no hydronephrosis and normal kidneys but thickened bladder wall, consistent with cystitis 4. Syncope, suspected dehydration, sepsis, infection as well as right ICA occlusion related , no recurrences , blood pressure is satisfactory and should be kept at the higher level at around 140 systolic since patient does have right ICA occlusion just beyond its origin on carotid ultrasound, mild left carotid bifurcation plaque, which is less than 50% stenosis, antegrade vertebral arterial flow . Patient received IV fluids while in the hospital and improved clinically  5. Community-acquired pneumonia, continue Cefdinir and  Zithromax orally for 6 more days as above, unable to get sputum cultures, repeated chest x-ray revealed pneumonia, still possible that patient had aspiration pneumonitis, following clinically. Follow-up with primary care physician as outpatient 6. Acute gastroenteritis, blood cultures are negative , stool cultures unremarkable, . C. difficile testing not done , diarrhea has resolved 7. Metabolic Encephalopathy due to infection in the setting of severe dementia, supportive care, appreciate physical and speech therapist input, now patient is on dysphagia 1 diet with nectar thick liquids, continue home health services at home   VITAL SIGNS:  Blood pressure 146/65, pulse 78, temperature 99.8 F (37.7 C), temperature source  Oral, resp. rate 18, height 5\' 6"  (1.676 m), weight 63.73 kg (140 lb 8 oz), SpO2 93 %.  I/O:   Intake/Output Summary (Last 24 hours) at 05/07/15 1347 Last data filed at 05/06/15 1800  Gross per 24 hour  Intake      0 ml  Output      0 ml  Net      0 ml    PHYSICAL EXAMINATION:  GENERAL:  81 y.o.-year-old patient lying in the bed with no acute distress.  EYES: Pupils equal, round, reactive to light and accommodation. No scleral icterus. Extraocular muscles intact.   HEENT: Head atraumatic, normocephalic. Oropharynx and nasopharynx clear.  NECK:  Supple, no jugular venous distention. No thyroid enlargement, no tenderness.  LUNGS: Normal breath sounds bilaterally, no wheezing, rales,rhonchi or crepitation. No use of accessory muscles of respiration.  CARDIOVASCULAR: S1, S2 normal. No murmurs, rubs, or gallops.  ABDOMEN: Soft, non-tender, non-distended. Bowel sounds present. No organomegaly or mass.  EXTREMITIES: No pedal edema, cyanosis, or clubbing.  NEUROLOGIC: Cranial nerves II through XII are intact. Muscle strength 5/5 in all extremities. Sensation intact. Gait not checked.  PSYCHIATRIC: The patient is alert and oriented x 3.  SKIN: No obvious rash, lesion, or ulcer.   DATA REVIEW:   CBC  Recent Labs Lab 05/04/15 0727  WBC 9.2  HGB 12.3  HCT 36.5  PLT 163    Chemistries   Recent Labs Lab 05/03/15 2220  05/05/15 0656  NA 142  < > 140  K 4.3  < > 3.7  CL 109  < > 112*  CO2 24  < > 22  GLUCOSE 199*  < > 89  BUN 17  < > 13  CREATININE 1.44*  < > 1.17*  CALCIUM 8.8*  < > 8.2*  AST 26  --   --   ALT 11*  --   --   ALKPHOS 117  --   --   BILITOT 0.4  --   --   < > = values in this interval not displayed.  Cardiac Enzymes  Recent Labs Lab 05/03/15 2220  TROPONINI <0.03    Microbiology Results  Results for orders placed or performed during the hospital encounter of 05/03/15  Blood Culture (routine x 2)     Status: None (Preliminary result)   Collection Time: 05/03/15 10:20 PM  Result Value Ref Range Status   Specimen Description BLOOD RIGHT ASSIST CONTROL  Final   Special Requests BOTTLES DRAWN AEROBIC AND ANAEROBIC 4CC  Final   Culture NO GROWTH 4 DAYS  Final   Report Status PENDING  Incomplete  Blood Culture (routine x 2)     Status: None (Preliminary result)   Collection Time: 05/03/15 10:20 PM  Result Value Ref Range Status   Specimen Description BLOOD LEFT ASSIST CONTROL  Final   Special Requests BOTTLES DRAWN  AEROBIC AND ANAEROBIC 4CC  Final   Culture NO GROWTH 4 DAYS  Final   Report Status PENDING  Incomplete  Urine culture     Status: None   Collection Time: 05/03/15 11:35 PM  Result Value Ref Range Status   Specimen Description URINE, RANDOM  Final   Special Requests NONE  Final   Culture >=100,000 COLONIES/mL ESCHERICHIA COLI  Final   Report Status 05/06/2015 FINAL  Final   Organism ID, Bacteria ESCHERICHIA COLI  Final      Susceptibility   Escherichia coli - MIC*    AMPICILLIN >=32 RESISTANT Resistant  CEFTAZIDIME <=1 SENSITIVE Sensitive     CEFAZOLIN <=4 SENSITIVE Sensitive     CEFTRIAXONE <=1 SENSITIVE Sensitive     CIPROFLOXACIN >=4 RESISTANT Resistant     GENTAMICIN <=1 SENSITIVE Sensitive     IMIPENEM <=0.25 SENSITIVE Sensitive     TRIMETH/SULFA <=20 SENSITIVE Sensitive     NITROFURANTOIN Value in next row Sensitive      SENSITIVE<=16    PIP/TAZO Value in next row Sensitive      SENSITIVE<=4    LEVOFLOXACIN Value in next row Resistant      RESISTANT>=8    * >=100,000 COLONIES/mL ESCHERICHIA COLI  Stool culture     Status: None   Collection Time: 05/04/15  6:03 PM  Result Value Ref Range Status   Specimen Description STOOL  Final   Special Requests Normal  Final   Culture   Final    NO SALMONELLA OR SHIGELLA ISOLATED NO CAMPYLOBACTER DETECTED No Pathogenic E. coli detected    Report Status 05/06/2015 FINAL  Final    RADIOLOGY:  US Renal  05/05/2015  CLINICAL DATA:  Renal insufficiency. EXAM: RENAL / URINARY TRACT ULTRASOUND COMPLETE COMPARISON:  None. FINDINGS: Right Kidney: Length: 8.1 cm. Echogenicity within normal limits. No mass or hydronephrosis visualized. Left Kidney: Length: 9.4 cm. Echogenicity within normal limits. No mass or hydronephrosis visualized. Bladder: Lateral wall is mildly thickened to 8 mm. The bladder is partially distended. IMPRESSION: 1. Normal kidneys without hydronephrosis. 2. Thickened bladder wall may represent nondistended state versus  bladder outlet obstruction or cystitis. Electronically Signed   By: Genevive Bi M.D.   On: 05/05/2015 15:42   US Carotid Bilateral  05/05/2015  CLINICAL DATA:  Stroke EXAM: BILATERAL CAROTID DUPLEX ULTRASOUND TECHNIQUE: Wallace Cullens scale imaging, color Doppler and duplex ultrasound was performed of bilateral carotid and vertebral arteries in the neck. COMPARISON:  CT 07/02/2011 previous REVIEW OF SYSTEMS: Quantification of carotid stenosis is based on velocity parameters that correlate the residual internal carotid diameter with NASCET-based stenosis levels, using the diameter of the distal internal carotid lumen as the denominator for stenosis measurement. The following velocity measurements were obtained: PEAK SYSTOLIC/END DIASTOLIC RIGHT ICA:  Occluded CCA:                     50/6cm/sec SYSTOLIC ICA/CCA RATIO:  Not applicable DIASTOLIC ICA/CCA RATIO: Not applicable ECA:                     105cm/sec LEFT ICA:                     65/14cm/sec CCA:                     64/11cm/sec SYSTOLIC ICA/CCA RATIO:  1.0 DIASTOLIC ICA/CCA RATIO: 1.2 ECA:                     113cm/sec FINDINGS: RIGHT CAROTID ARTERY: High resistance waveform in the common carotid artery. Calcified plaque in the bulb. Minimal plaque at the external carotid origin without significant stenosis. Occlusion of the ICA just beyond its origin with no color or Doppler flow signal. No evidence of string sign on color or power Doppler. RIGHT VERTEBRAL ARTERY:  Normal flow direction and waveform. LEFT CAROTID ARTERY: Eccentric partially calcified plaque in the distal common carotid artery and bulb resulting in mild stenosis. Normal waveforms and color Doppler signal. Distal ICA tortuous. LEFT VERTEBRAL ARTERY: Normal flow direction and waveform. IMPRESSION: 1.  Origin occlusion of the right ICA without evidence of string sign. 2. Mild left carotid bifurcation plaque resulting in less than 50% diameter stenosis. 3. Antegrade vertebral arterial flow  bilaterally. Electronically Signed   By: Corlis Leak  Hassell M.D.   On: 05/05/2015 15:59   Dg Chest Port 1 View  05/07/2015  CLINICAL DATA:  Pneumonia EXAM: PORTABLE CHEST 1 VIEW COMPARISON:  05/03/2015 FINDINGS: Stable enlarged cardiac silhouette. There is LEFT lower lobe retrocardiac opacity. There is fine airspace disease in the LEFT lower lobe superior to the the heart. Obscuration of LEFT hemidiaphragm. There is underlying chronic interstitial lung disease. IMPRESSION: Increased density of LEFT lower lobe opacity concerning for pneumonia. Chronic interstitial lung disease. Electronically Signed   By: Genevive BiStewart  Edmunds M.D.   On: 05/07/2015 08:35    EKG:   Orders placed or performed during the hospital encounter of 05/03/15  . EKG 12-Lead  . EKG 12-Lead  . EKG 12-Lead  . EKG 12-Lead  . EKG 12-Lead  . EKG 12-Lead      Management plans discussed with the patient, family and they are in agreement.  CODE STATUS:     Code Status Orders        Start     Ordered   05/04/15 0303  Full code   Continuous     05/04/15 0302      TOTAL TIME TAKING CARE OF THIS PATIENT: 40 minutes.    Katharina CaperVAICKUTE,Gurnoor Ursua M.D on 05/07/2015 at 1:47 PM  Between 7am to 6pm - Pager - [phone]  After 6pm go to [email or web address] - password [email or web address] [email or web address]  [facility]  Office  [phone]  CC: Primary care physician; Ailene RavelHAMRICK,MAURA L, MD

## 2015-05-07 NOTE — Care Management Note (Signed)
Case Management Note  Patient Details  Name: Kathleen Graham MRN: [id number] Date of Birth: 1932-04-13  Subjective/Objective:    Discussed discharge planning with daughter-in-law Kathleen Graham with whom Kathleen Graham resides. Marcelino DusterMichelle chose [facility] but [facility] reports that they no longer service the area of the daughter-in-law's address. Message left on [name]'s home phone updating her. A referral request for home health services has been [facility] and [facility].                Action/Plan:   Expected Discharge Date:                  Expected Discharge Plan:     In-House Referral:     Discharge planning Services     Post Acute Care Choice:    Choice offered to:     DME Arranged:    DME Agency:     HH Arranged:    HH Agency:     Status of Service:     Medicare Important Message Given:  Yes Date Medicare IM Given:    Medicare IM give by:    Date Additional Medicare IM Given:    Additional Medicare Important Message give by:     If discussed at Long Length of Stay Meetings, dates discussed:    Additional Comments:  [name], RN 05/07/2015, 1:23 PM

## 2015-05-07 NOTE — Care Management Note (Signed)
Case Management Note  Patient Details  Name: Kathleen Graham MRN: [id number] Date of Birth: 1931-08-12  Subjective/Objective:         Message left on cell phone of Inge RiseMichelle Printup requesting a call back to discuss discharge planning today.           Action/Plan:   Expected Discharge Date:                  Expected Discharge Plan:     In-House Referral:     Discharge planning Services     Post Acute Care Choice:    Choice offered to:     DME Arranged:    DME Agency:     HH Arranged:    HH Agency:     Status of Service:     Medicare Important Message Given:  Yes Date Medicare IM Given:    Medicare IM give by:    Date Additional Medicare IM Given:    Additional Medicare Important Message give by:     If discussed at Long Length of Stay Meetings, dates discussed:    Additional Comments:  Robbie Rideaux A, RN 05/07/2015, 12:54 PM

## 2015-05-08 LAB — CULTURE, BLOOD (ROUTINE X 2): Culture: NO GROWTH

## 2015-05-09 DIAGNOSIS — Z87440 Personal history of urinary (tract) infections: Secondary | ICD-10-CM | POA: Diagnosis not present

## 2015-05-09 DIAGNOSIS — G309 Alzheimer's disease, unspecified: Secondary | ICD-10-CM | POA: Diagnosis not present

## 2015-05-09 DIAGNOSIS — R159 Full incontinence of feces: Secondary | ICD-10-CM | POA: Diagnosis not present

## 2015-05-09 DIAGNOSIS — N183 Chronic kidney disease, stage 3 (moderate): Secondary | ICD-10-CM | POA: Diagnosis not present

## 2015-05-09 DIAGNOSIS — A4189 Other specified sepsis: Secondary | ICD-10-CM | POA: Diagnosis not present

## 2015-05-09 DIAGNOSIS — M6281 Muscle weakness (generalized): Secondary | ICD-10-CM | POA: Diagnosis not present

## 2015-05-09 DIAGNOSIS — J189 Pneumonia, unspecified organism: Secondary | ICD-10-CM | POA: Diagnosis not present

## 2015-05-09 DIAGNOSIS — N3090 Cystitis, unspecified without hematuria: Secondary | ICD-10-CM | POA: Diagnosis not present

## 2015-05-09 DIAGNOSIS — I10 Essential (primary) hypertension: Secondary | ICD-10-CM | POA: Diagnosis not present

## 2015-05-09 LAB — CULTURE, BLOOD (ROUTINE X 2): Culture: NO GROWTH

## 2015-05-11 DIAGNOSIS — G309 Alzheimer's disease, unspecified: Secondary | ICD-10-CM | POA: Diagnosis not present

## 2015-05-11 DIAGNOSIS — M6281 Muscle weakness (generalized): Secondary | ICD-10-CM | POA: Diagnosis not present

## 2015-05-11 DIAGNOSIS — J189 Pneumonia, unspecified organism: Secondary | ICD-10-CM | POA: Diagnosis not present

## 2015-05-11 DIAGNOSIS — R159 Full incontinence of feces: Secondary | ICD-10-CM | POA: Diagnosis not present

## 2015-05-11 DIAGNOSIS — A4189 Other specified sepsis: Secondary | ICD-10-CM | POA: Diagnosis not present

## 2015-05-11 DIAGNOSIS — I10 Essential (primary) hypertension: Secondary | ICD-10-CM | POA: Diagnosis not present

## 2015-05-11 DIAGNOSIS — N183 Chronic kidney disease, stage 3 (moderate): Secondary | ICD-10-CM | POA: Diagnosis not present

## 2015-05-11 DIAGNOSIS — Z87440 Personal history of urinary (tract) infections: Secondary | ICD-10-CM | POA: Diagnosis not present

## 2015-05-11 DIAGNOSIS — N3090 Cystitis, unspecified without hematuria: Secondary | ICD-10-CM | POA: Diagnosis not present

## 2015-05-12 DIAGNOSIS — A4189 Other specified sepsis: Secondary | ICD-10-CM | POA: Diagnosis not present

## 2015-05-12 DIAGNOSIS — J189 Pneumonia, unspecified organism: Secondary | ICD-10-CM | POA: Diagnosis not present

## 2015-05-12 DIAGNOSIS — M6281 Muscle weakness (generalized): Secondary | ICD-10-CM | POA: Diagnosis not present

## 2015-05-12 DIAGNOSIS — R159 Full incontinence of feces: Secondary | ICD-10-CM | POA: Diagnosis not present

## 2015-05-12 DIAGNOSIS — N183 Chronic kidney disease, stage 3 (moderate): Secondary | ICD-10-CM | POA: Diagnosis not present

## 2015-05-12 DIAGNOSIS — N3090 Cystitis, unspecified without hematuria: Secondary | ICD-10-CM | POA: Diagnosis not present

## 2015-05-12 DIAGNOSIS — G309 Alzheimer's disease, unspecified: Secondary | ICD-10-CM | POA: Diagnosis not present

## 2015-05-12 DIAGNOSIS — Z87440 Personal history of urinary (tract) infections: Secondary | ICD-10-CM | POA: Diagnosis not present

## 2015-05-12 DIAGNOSIS — I10 Essential (primary) hypertension: Secondary | ICD-10-CM | POA: Diagnosis not present

## 2015-05-16 DIAGNOSIS — J449 Chronic obstructive pulmonary disease, unspecified: Secondary | ICD-10-CM | POA: Diagnosis not present

## 2015-05-17 DIAGNOSIS — M6281 Muscle weakness (generalized): Secondary | ICD-10-CM | POA: Diagnosis not present

## 2015-05-17 DIAGNOSIS — A4189 Other specified sepsis: Secondary | ICD-10-CM | POA: Diagnosis not present

## 2015-05-17 DIAGNOSIS — N3090 Cystitis, unspecified without hematuria: Secondary | ICD-10-CM | POA: Diagnosis not present

## 2015-05-17 DIAGNOSIS — J189 Pneumonia, unspecified organism: Secondary | ICD-10-CM | POA: Diagnosis not present

## 2015-05-17 DIAGNOSIS — Z87440 Personal history of urinary (tract) infections: Secondary | ICD-10-CM | POA: Diagnosis not present

## 2015-05-17 DIAGNOSIS — I10 Essential (primary) hypertension: Secondary | ICD-10-CM | POA: Diagnosis not present

## 2015-05-17 DIAGNOSIS — R159 Full incontinence of feces: Secondary | ICD-10-CM | POA: Diagnosis not present

## 2015-05-17 DIAGNOSIS — N183 Chronic kidney disease, stage 3 (moderate): Secondary | ICD-10-CM | POA: Diagnosis not present

## 2015-05-17 DIAGNOSIS — G309 Alzheimer's disease, unspecified: Secondary | ICD-10-CM | POA: Diagnosis not present

## 2015-05-23 DIAGNOSIS — Z87440 Personal history of urinary (tract) infections: Secondary | ICD-10-CM | POA: Diagnosis not present

## 2015-05-23 DIAGNOSIS — M6281 Muscle weakness (generalized): Secondary | ICD-10-CM | POA: Diagnosis not present

## 2015-05-23 DIAGNOSIS — N183 Chronic kidney disease, stage 3 (moderate): Secondary | ICD-10-CM | POA: Diagnosis not present

## 2015-05-23 DIAGNOSIS — R159 Full incontinence of feces: Secondary | ICD-10-CM | POA: Diagnosis not present

## 2015-05-23 DIAGNOSIS — J189 Pneumonia, unspecified organism: Secondary | ICD-10-CM | POA: Diagnosis not present

## 2015-05-23 DIAGNOSIS — A4189 Other specified sepsis: Secondary | ICD-10-CM | POA: Diagnosis not present

## 2015-05-23 DIAGNOSIS — I10 Essential (primary) hypertension: Secondary | ICD-10-CM | POA: Diagnosis not present

## 2015-05-23 DIAGNOSIS — G309 Alzheimer's disease, unspecified: Secondary | ICD-10-CM | POA: Diagnosis not present

## 2015-05-23 DIAGNOSIS — N3090 Cystitis, unspecified without hematuria: Secondary | ICD-10-CM | POA: Diagnosis not present

## 2015-05-25 DIAGNOSIS — J189 Pneumonia, unspecified organism: Secondary | ICD-10-CM | POA: Diagnosis not present

## 2015-05-25 DIAGNOSIS — M6281 Muscle weakness (generalized): Secondary | ICD-10-CM | POA: Diagnosis not present

## 2015-05-25 DIAGNOSIS — N183 Chronic kidney disease, stage 3 (moderate): Secondary | ICD-10-CM | POA: Diagnosis not present

## 2015-05-25 DIAGNOSIS — N3090 Cystitis, unspecified without hematuria: Secondary | ICD-10-CM | POA: Diagnosis not present

## 2015-05-25 DIAGNOSIS — I10 Essential (primary) hypertension: Secondary | ICD-10-CM | POA: Diagnosis not present

## 2015-05-25 DIAGNOSIS — R159 Full incontinence of feces: Secondary | ICD-10-CM | POA: Diagnosis not present

## 2015-05-25 DIAGNOSIS — A4189 Other specified sepsis: Secondary | ICD-10-CM | POA: Diagnosis not present

## 2015-05-25 DIAGNOSIS — G309 Alzheimer's disease, unspecified: Secondary | ICD-10-CM | POA: Diagnosis not present

## 2015-05-25 DIAGNOSIS — Z87440 Personal history of urinary (tract) infections: Secondary | ICD-10-CM | POA: Diagnosis not present

## 2015-06-09 DIAGNOSIS — N183 Chronic kidney disease, stage 3 (moderate): Secondary | ICD-10-CM | POA: Diagnosis not present

## 2015-06-09 DIAGNOSIS — N3090 Cystitis, unspecified without hematuria: Secondary | ICD-10-CM | POA: Diagnosis not present

## 2015-06-09 DIAGNOSIS — R159 Full incontinence of feces: Secondary | ICD-10-CM | POA: Diagnosis not present

## 2015-06-09 DIAGNOSIS — I10 Essential (primary) hypertension: Secondary | ICD-10-CM | POA: Diagnosis not present

## 2015-06-09 DIAGNOSIS — J189 Pneumonia, unspecified organism: Secondary | ICD-10-CM | POA: Diagnosis not present

## 2015-06-09 DIAGNOSIS — A4189 Other specified sepsis: Secondary | ICD-10-CM | POA: Diagnosis not present

## 2015-06-09 DIAGNOSIS — G309 Alzheimer's disease, unspecified: Secondary | ICD-10-CM | POA: Diagnosis not present

## 2015-06-09 DIAGNOSIS — Z87440 Personal history of urinary (tract) infections: Secondary | ICD-10-CM | POA: Diagnosis not present

## 2015-06-09 DIAGNOSIS — M6281 Muscle weakness (generalized): Secondary | ICD-10-CM | POA: Diagnosis not present

## 2015-06-16 DIAGNOSIS — J449 Chronic obstructive pulmonary disease, unspecified: Secondary | ICD-10-CM | POA: Diagnosis not present

## 2015-07-17 DIAGNOSIS — J449 Chronic obstructive pulmonary disease, unspecified: Secondary | ICD-10-CM | POA: Diagnosis not present

## 2015-07-21 DIAGNOSIS — S71102A Unspecified open wound, left thigh, initial encounter: Secondary | ICD-10-CM | POA: Diagnosis not present

## 2015-07-21 DIAGNOSIS — I10 Essential (primary) hypertension: Secondary | ICD-10-CM | POA: Diagnosis not present

## 2015-07-21 DIAGNOSIS — J449 Chronic obstructive pulmonary disease, unspecified: Secondary | ICD-10-CM | POA: Diagnosis not present

## 2015-07-21 DIAGNOSIS — Z87891 Personal history of nicotine dependence: Secondary | ICD-10-CM | POA: Diagnosis not present

## 2015-07-21 DIAGNOSIS — I509 Heart failure, unspecified: Secondary | ICD-10-CM | POA: Diagnosis not present

## 2015-07-21 DIAGNOSIS — M1990 Unspecified osteoarthritis, unspecified site: Secondary | ICD-10-CM | POA: Diagnosis not present

## 2015-07-21 DIAGNOSIS — S71002A Unspecified open wound, left hip, initial encounter: Secondary | ICD-10-CM | POA: Diagnosis not present

## 2015-07-24 DIAGNOSIS — N183 Chronic kidney disease, stage 3 (moderate): Secondary | ICD-10-CM | POA: Diagnosis not present

## 2015-07-24 DIAGNOSIS — J449 Chronic obstructive pulmonary disease, unspecified: Secondary | ICD-10-CM | POA: Diagnosis not present

## 2015-07-24 DIAGNOSIS — E782 Mixed hyperlipidemia: Secondary | ICD-10-CM | POA: Diagnosis not present

## 2015-07-24 DIAGNOSIS — G309 Alzheimer's disease, unspecified: Secondary | ICD-10-CM | POA: Diagnosis not present

## 2015-07-24 DIAGNOSIS — Z1389 Encounter for screening for other disorder: Secondary | ICD-10-CM | POA: Diagnosis not present

## 2015-07-31 DIAGNOSIS — S71102A Unspecified open wound, left thigh, initial encounter: Secondary | ICD-10-CM | POA: Diagnosis not present

## 2015-07-31 DIAGNOSIS — S70312D Abrasion, left thigh, subsequent encounter: Secondary | ICD-10-CM | POA: Diagnosis not present

## 2015-08-08 DIAGNOSIS — S71102A Unspecified open wound, left thigh, initial encounter: Secondary | ICD-10-CM | POA: Diagnosis not present

## 2015-08-08 DIAGNOSIS — S31104A Unspecified open wound of abdominal wall, left lower quadrant without penetration into peritoneal cavity, initial encounter: Secondary | ICD-10-CM | POA: Diagnosis not present

## 2015-08-08 DIAGNOSIS — S70312D Abrasion, left thigh, subsequent encounter: Secondary | ICD-10-CM | POA: Diagnosis not present

## 2015-08-08 DIAGNOSIS — R4182 Altered mental status, unspecified: Secondary | ICD-10-CM | POA: Diagnosis not present

## 2015-08-08 DIAGNOSIS — S71002A Unspecified open wound, left hip, initial encounter: Secondary | ICD-10-CM | POA: Diagnosis not present

## 2015-08-08 DIAGNOSIS — S31104D Unspecified open wound of abdominal wall, left lower quadrant without penetration into peritoneal cavity, subsequent encounter: Secondary | ICD-10-CM | POA: Diagnosis not present

## 2015-08-14 DIAGNOSIS — J449 Chronic obstructive pulmonary disease, unspecified: Secondary | ICD-10-CM | POA: Diagnosis not present

## 2015-08-15 DIAGNOSIS — S31104D Unspecified open wound of abdominal wall, left lower quadrant without penetration into peritoneal cavity, subsequent encounter: Secondary | ICD-10-CM | POA: Diagnosis not present

## 2015-08-15 DIAGNOSIS — S71002A Unspecified open wound, left hip, initial encounter: Secondary | ICD-10-CM | POA: Diagnosis not present

## 2015-08-15 DIAGNOSIS — S71102A Unspecified open wound, left thigh, initial encounter: Secondary | ICD-10-CM | POA: Diagnosis not present

## 2015-08-16 DIAGNOSIS — N390 Urinary tract infection, site not specified: Secondary | ICD-10-CM | POA: Diagnosis not present

## 2015-08-24 DIAGNOSIS — S31104D Unspecified open wound of abdominal wall, left lower quadrant without penetration into peritoneal cavity, subsequent encounter: Secondary | ICD-10-CM | POA: Diagnosis not present

## 2015-08-24 DIAGNOSIS — S71112A Laceration without foreign body, left thigh, initial encounter: Secondary | ICD-10-CM | POA: Diagnosis not present

## 2015-08-30 DIAGNOSIS — Z872 Personal history of diseases of the skin and subcutaneous tissue: Secondary | ICD-10-CM | POA: Diagnosis not present

## 2015-08-30 DIAGNOSIS — Z09 Encounter for follow-up examination after completed treatment for conditions other than malignant neoplasm: Secondary | ICD-10-CM | POA: Diagnosis not present

## 2015-09-14 DIAGNOSIS — J449 Chronic obstructive pulmonary disease, unspecified: Secondary | ICD-10-CM | POA: Diagnosis not present

## 2015-10-14 DIAGNOSIS — J449 Chronic obstructive pulmonary disease, unspecified: Secondary | ICD-10-CM | POA: Diagnosis not present

## 2015-10-16 DIAGNOSIS — R350 Frequency of micturition: Secondary | ICD-10-CM | POA: Diagnosis not present

## 2015-10-16 DIAGNOSIS — H579 Unspecified disorder of eye and adnexa: Secondary | ICD-10-CM | POA: Diagnosis not present

## 2015-10-16 DIAGNOSIS — N390 Urinary tract infection, site not specified: Secondary | ICD-10-CM | POA: Diagnosis not present

## 2015-10-16 DIAGNOSIS — Z6820 Body mass index (BMI) 20.0-20.9, adult: Secondary | ICD-10-CM | POA: Diagnosis not present

## 2015-11-09 DIAGNOSIS — E860 Dehydration: Secondary | ICD-10-CM | POA: Diagnosis not present

## 2015-11-09 DIAGNOSIS — R509 Fever, unspecified: Secondary | ICD-10-CM | POA: Diagnosis not present

## 2015-11-09 DIAGNOSIS — N390 Urinary tract infection, site not specified: Secondary | ICD-10-CM | POA: Diagnosis not present

## 2015-11-14 DIAGNOSIS — J449 Chronic obstructive pulmonary disease, unspecified: Secondary | ICD-10-CM | POA: Diagnosis not present

## 2015-12-06 DIAGNOSIS — R600 Localized edema: Secondary | ICD-10-CM | POA: Diagnosis not present

## 2015-12-06 DIAGNOSIS — L989 Disorder of the skin and subcutaneous tissue, unspecified: Secondary | ICD-10-CM | POA: Diagnosis not present

## 2015-12-14 DIAGNOSIS — J449 Chronic obstructive pulmonary disease, unspecified: Secondary | ICD-10-CM | POA: Diagnosis not present

## 2016-01-09 DIAGNOSIS — N390 Urinary tract infection, site not specified: Secondary | ICD-10-CM | POA: Diagnosis not present

## 2016-01-14 DIAGNOSIS — J449 Chronic obstructive pulmonary disease, unspecified: Secondary | ICD-10-CM | POA: Diagnosis not present

## 2016-01-18 DIAGNOSIS — J449 Chronic obstructive pulmonary disease, unspecified: Secondary | ICD-10-CM | POA: Diagnosis not present

## 2016-01-18 DIAGNOSIS — R269 Unspecified abnormalities of gait and mobility: Secondary | ICD-10-CM | POA: Diagnosis not present

## 2016-01-18 DIAGNOSIS — M6281 Muscle weakness (generalized): Secondary | ICD-10-CM | POA: Diagnosis not present

## 2016-01-18 DIAGNOSIS — R0602 Shortness of breath: Secondary | ICD-10-CM | POA: Diagnosis not present

## 2016-01-22 DIAGNOSIS — E782 Mixed hyperlipidemia: Secondary | ICD-10-CM | POA: Diagnosis not present

## 2016-01-22 DIAGNOSIS — I10 Essential (primary) hypertension: Secondary | ICD-10-CM | POA: Diagnosis not present

## 2016-01-22 DIAGNOSIS — Z79899 Other long term (current) drug therapy: Secondary | ICD-10-CM | POA: Diagnosis not present

## 2016-01-22 DIAGNOSIS — Z23 Encounter for immunization: Secondary | ICD-10-CM | POA: Diagnosis not present

## 2016-01-22 DIAGNOSIS — N183 Chronic kidney disease, stage 3 (moderate): Secondary | ICD-10-CM | POA: Diagnosis not present

## 2016-01-22 DIAGNOSIS — G309 Alzheimer's disease, unspecified: Secondary | ICD-10-CM | POA: Diagnosis not present

## 2016-02-14 DIAGNOSIS — J449 Chronic obstructive pulmonary disease, unspecified: Secondary | ICD-10-CM | POA: Diagnosis not present

## 2016-02-18 DIAGNOSIS — M6281 Muscle weakness (generalized): Secondary | ICD-10-CM | POA: Diagnosis not present

## 2016-03-15 DIAGNOSIS — J449 Chronic obstructive pulmonary disease, unspecified: Secondary | ICD-10-CM | POA: Diagnosis not present

## 2016-03-19 DIAGNOSIS — M6281 Muscle weakness (generalized): Secondary | ICD-10-CM | POA: Diagnosis not present

## 2016-03-31 IMAGING — CT CT HEAD WITHOUT CONTRAST
2 series · 14 of 30 positions shown, 16 images · non-contrast
Comparison: None.

CLINICAL DATA: Syncope, dementia,

EXAM:
CT HEAD WITHOUT CONTRAST
TECHNIQUE: Contiguous axial images were obtained from the base of the skull
through the vertex without intravenous contrast.

[Series 2: head wo · axial · 0.43mm/px · z∈[-127,-28]mm · 6 of 32 slices shown, 8 images]
[im 5/32  brain]
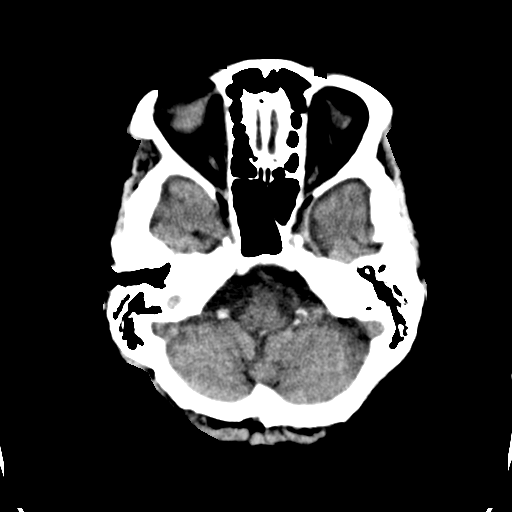
[im 5/32  bone]
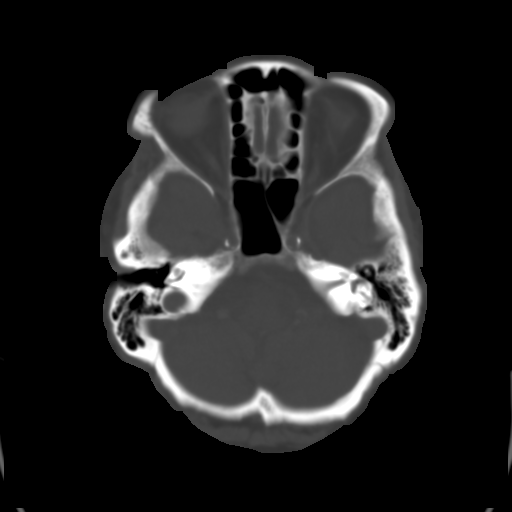
[im 9/32  brain]
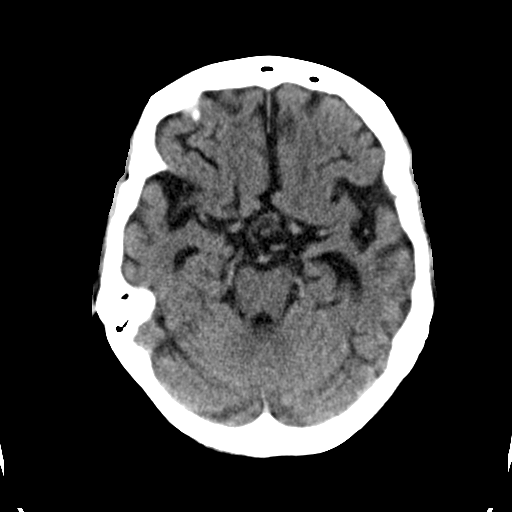
[im 14/32  brain]
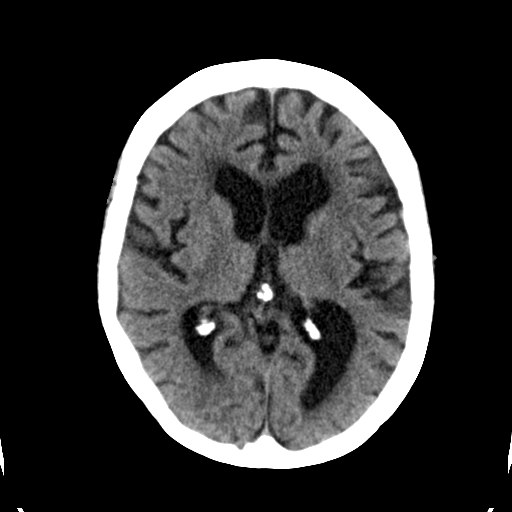
[im 18/32  brain]
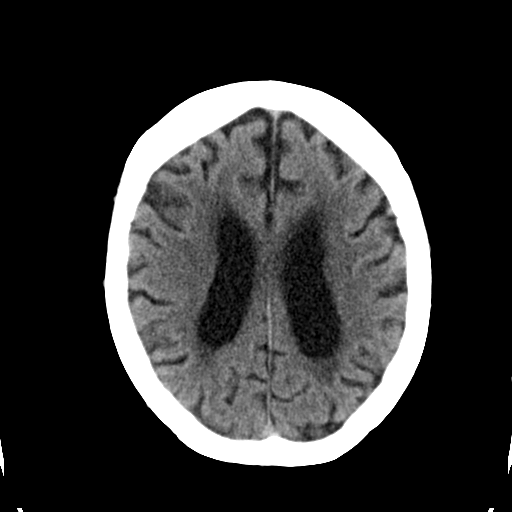
[im 23/32  brain]
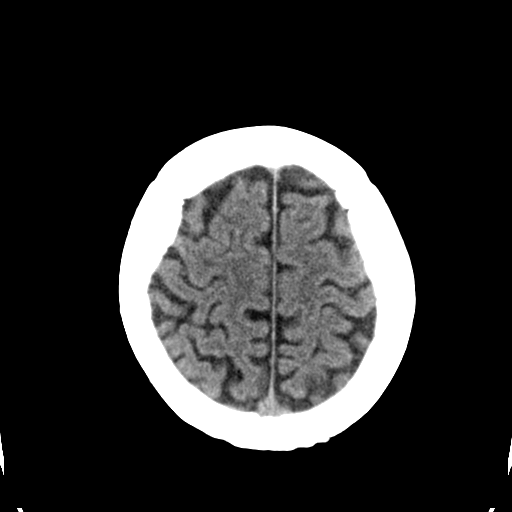
[im 23/32  bone]
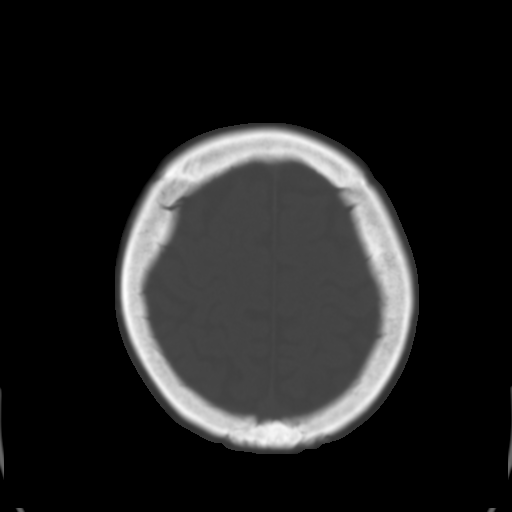
[im 27/32  brain]
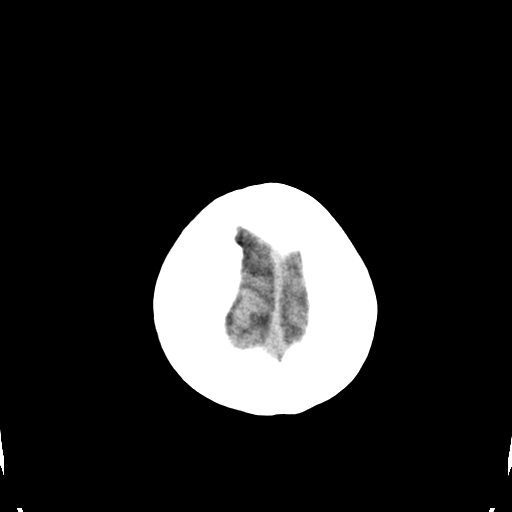

[Series 3: head bone · axial · 0.43mm/px · z∈[-133,-19]mm · 8 of 96 slices shown]
[im 10/96  bone]
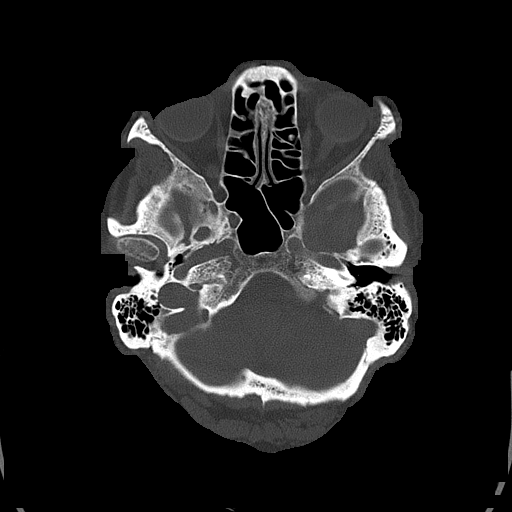
[im 19/96  bone]
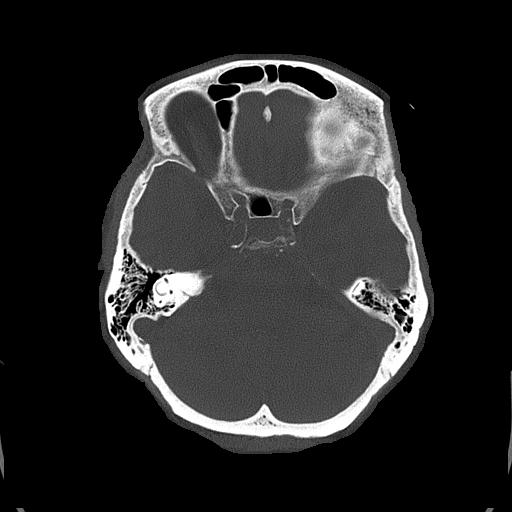
[im 32/96  bone]
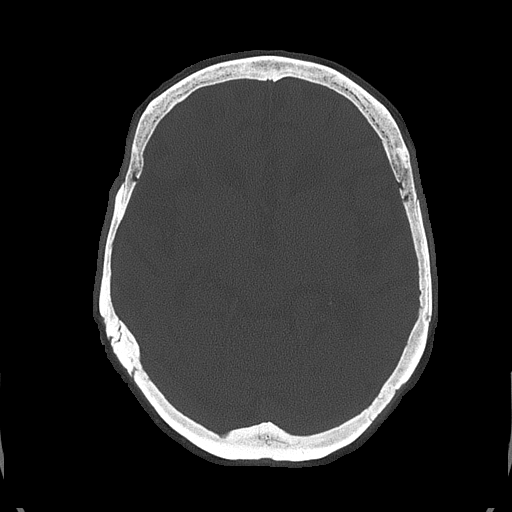
[im 41/96  bone]
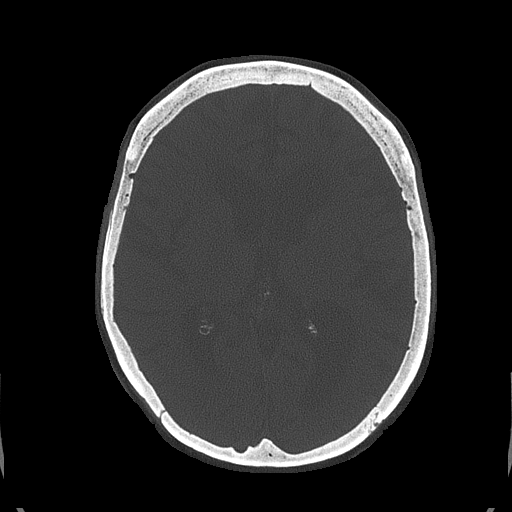
[im 55/96  bone]
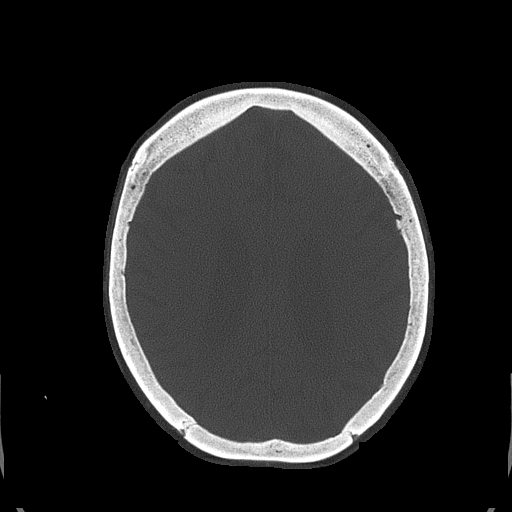
[im 64/96  bone]
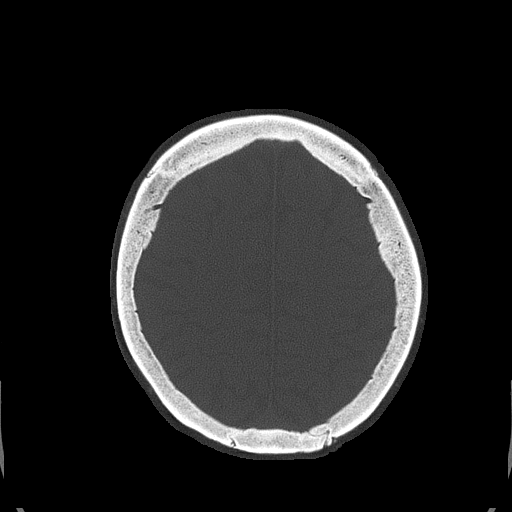
[im 77/96  bone]
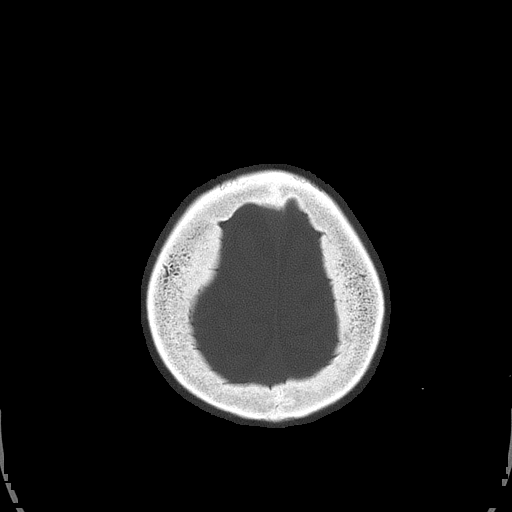
[im 86/96  bone]
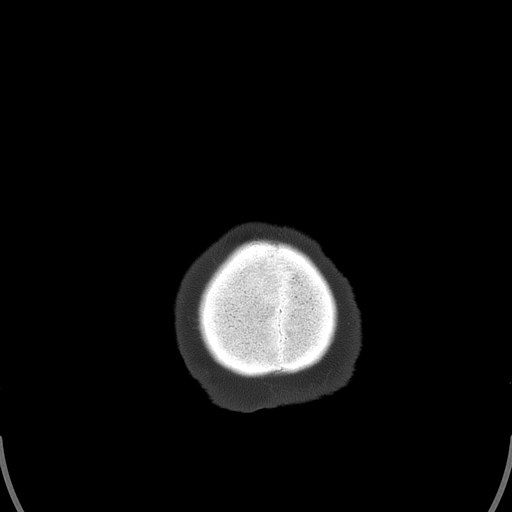

[14 of 30 positions shown; findings below may reference images not displayed]

FINDINGS: There is no evidence of mass effect, midline shift, or extra-axial
fluid collections. There is no evidence of a space-occupying lesion
or intracranial hemorrhage. There is no evidence of a cortical-based
area of acute infarction. There is generalized cerebral atrophy.
There is periventricular white matter low attenuation likely
secondary to microangiopathy.

The ventricles and sulci are appropriate for the patient's age. The
basal cisterns are patent.

Visualized portions of the orbits are unremarkable. The visualized
portions of the paranasal sinuses and mastoid air cells are
unremarkable. Cerebrovascular atherosclerotic calcifications are
noted.

The osseous structures are unremarkable.
IMPRESSION: No acute intracranial pathology.

## 2016-03-31 IMAGING — CR DG ABDOMEN 3V
1 series · 3 of 3 positions shown · non-contrast
Comparison: None.

CLINICAL DATA: Vomiting, diarrhea

EXAM:
ABDOMEN SERIES

[Series 1: x chest ap · 0.14mm/px · 3 of 3 slices shown]
[im 1/3]
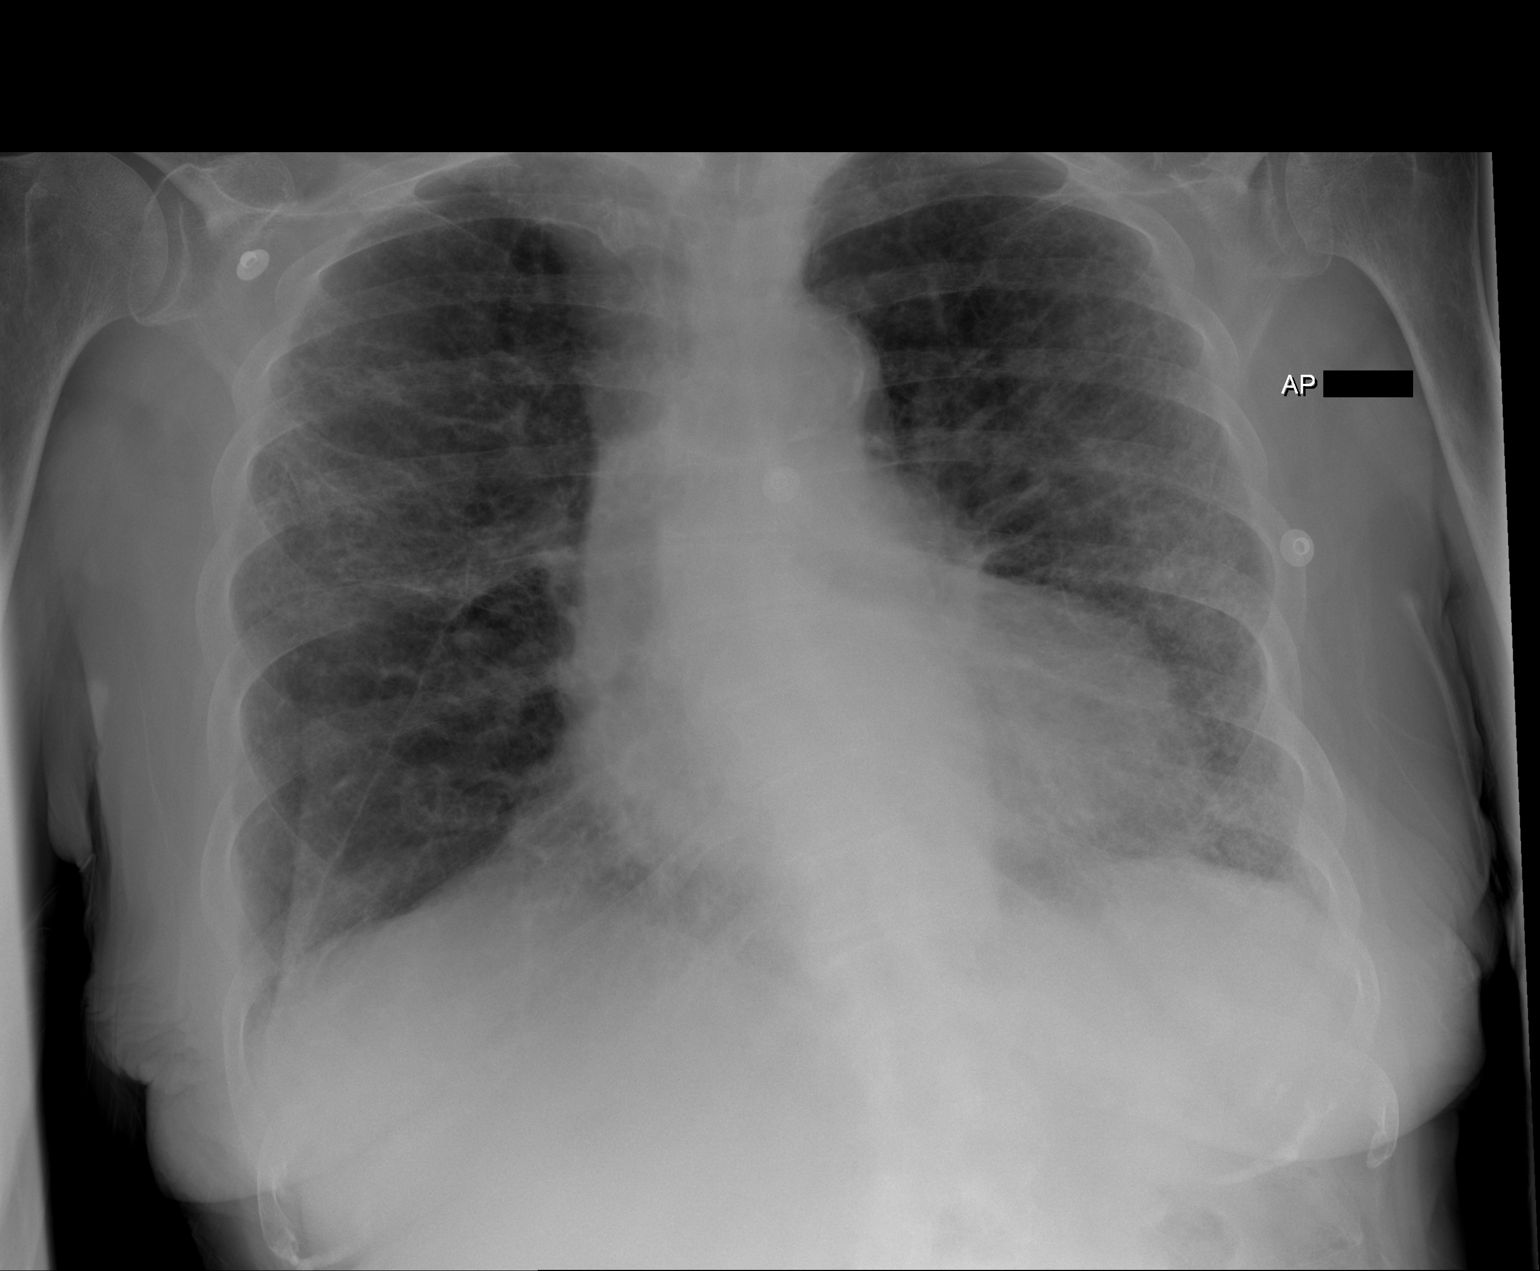
[im 2/3]
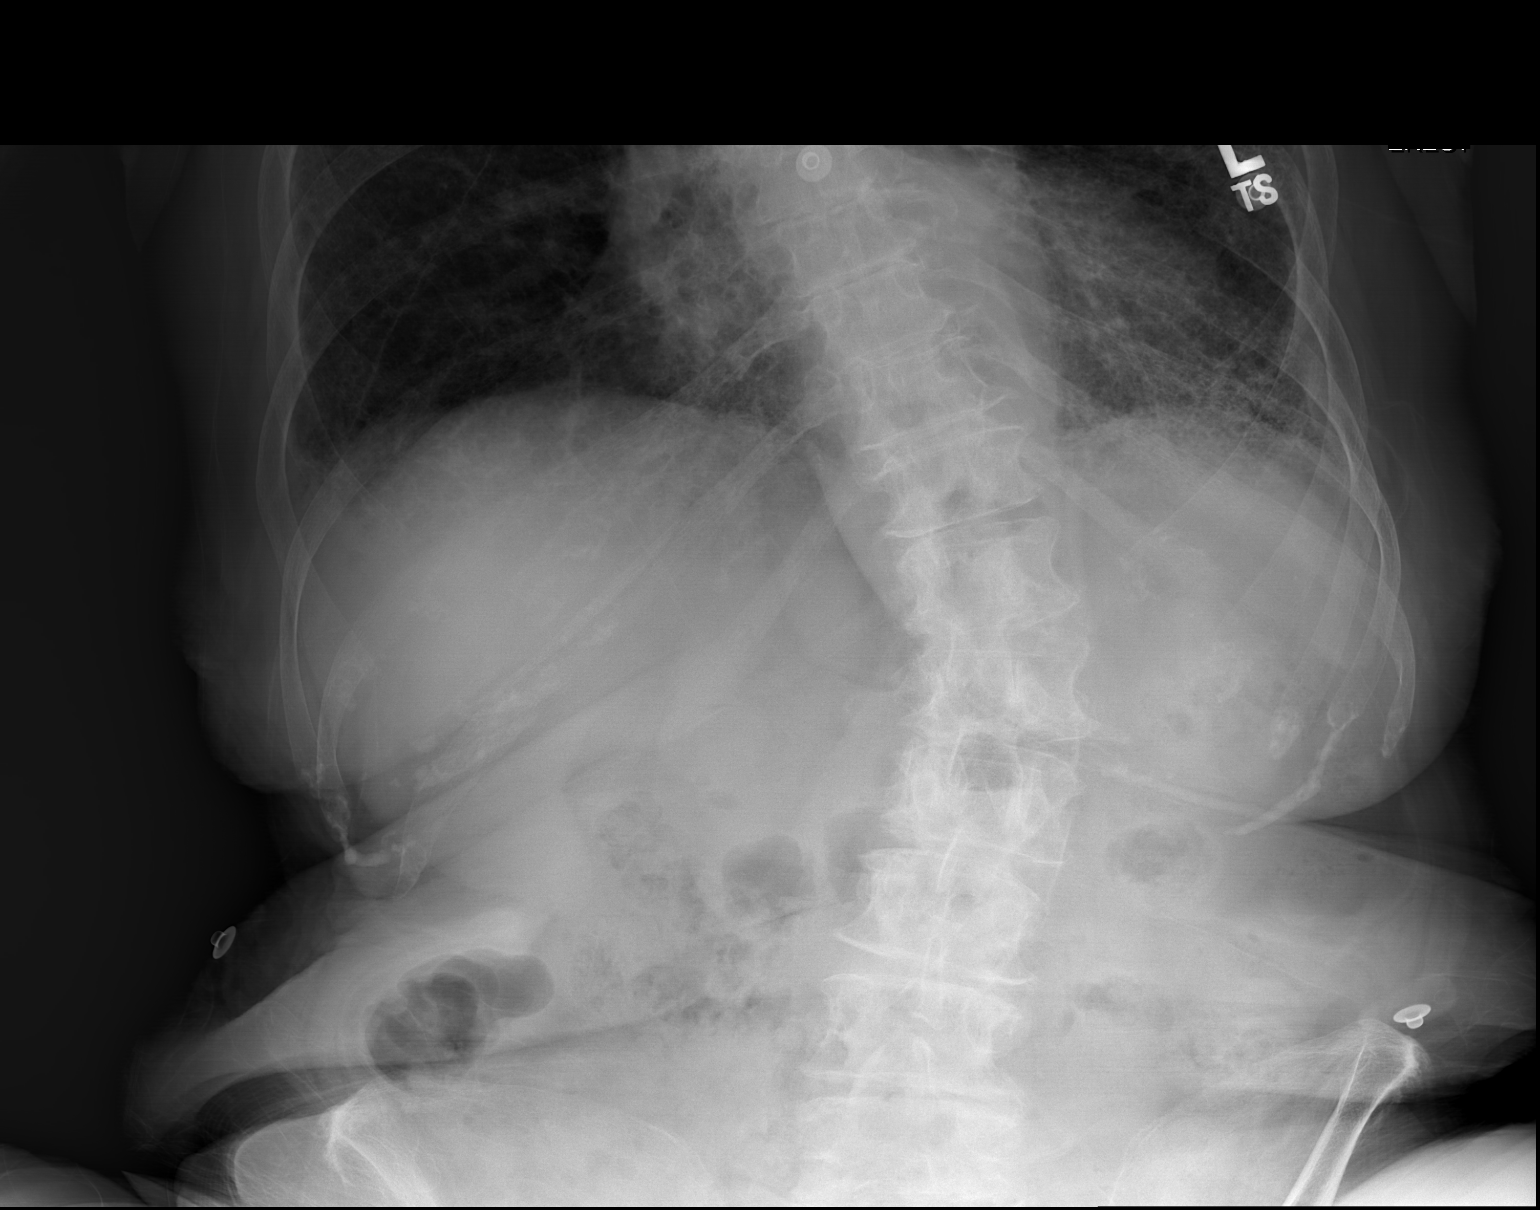
[im 3/3]
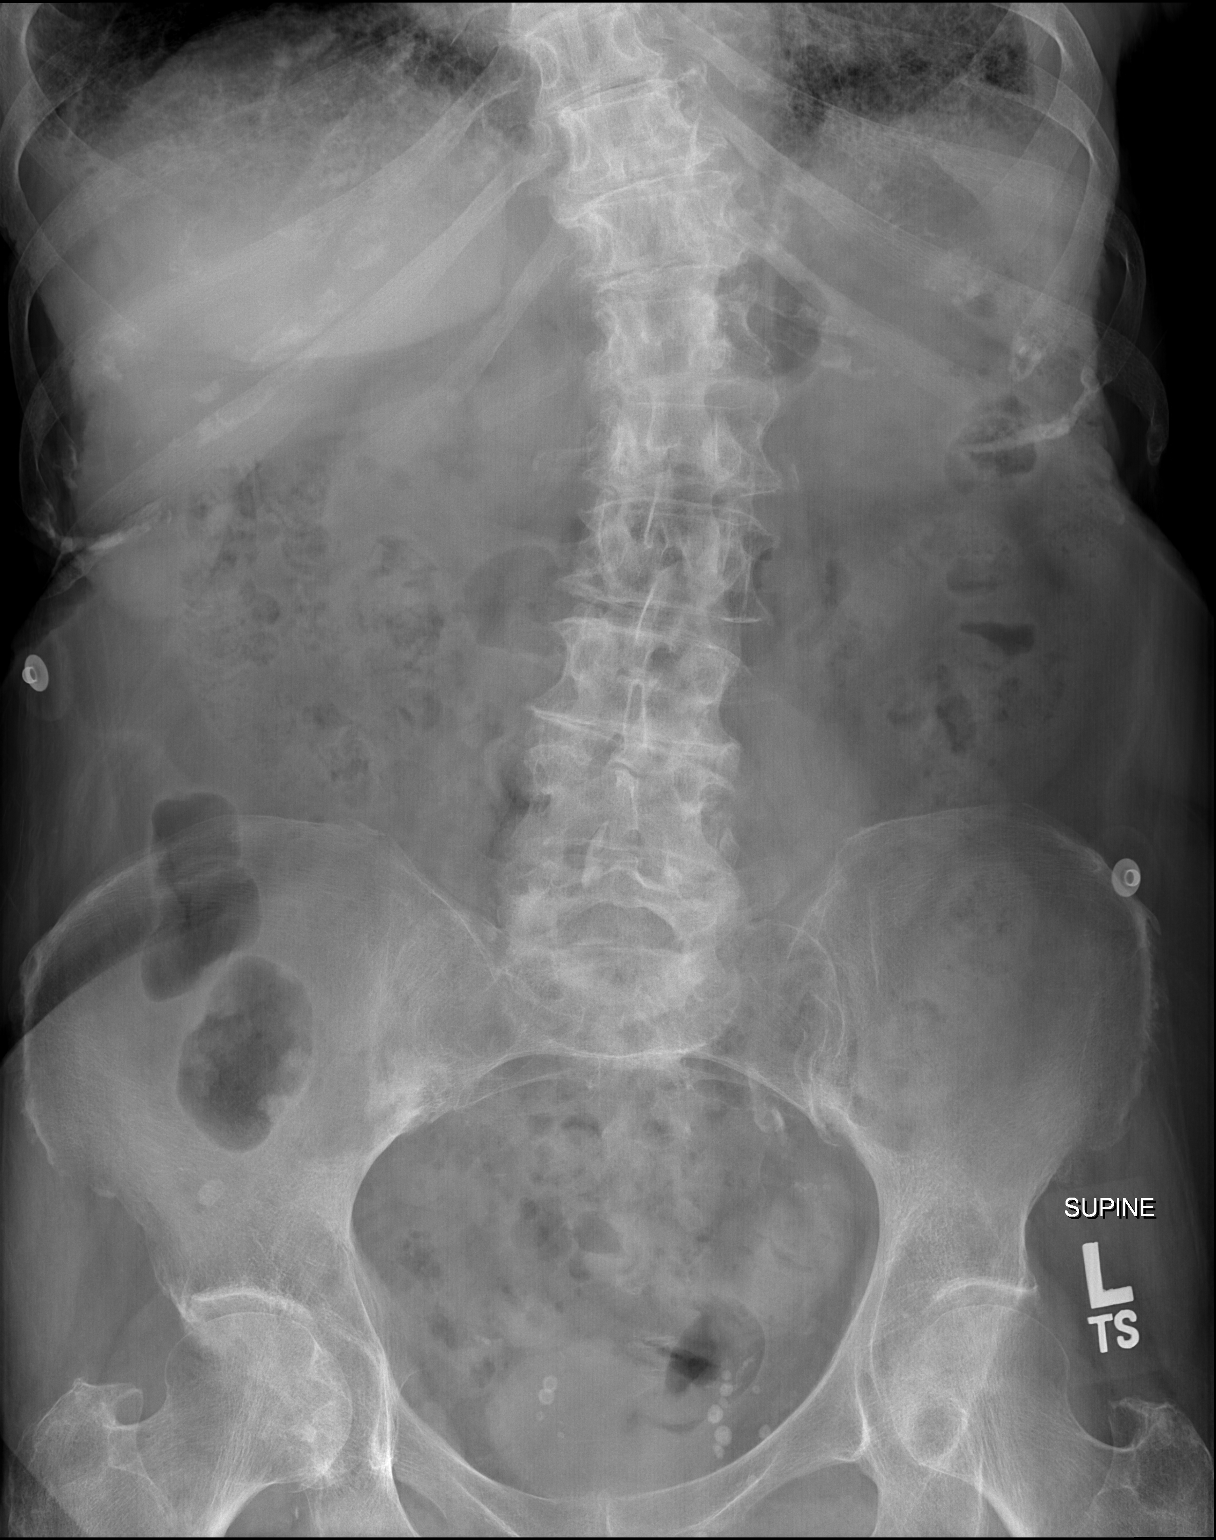

[3 of 3 positions shown; findings below may reference images not displayed]

FINDINGS: There is chronic interstitial lung disease. There is no focal
consolidation, pleural effusion or pneumothorax. The heart and
mediastinal contours are unremarkable. The osseous structures are
unremarkable.

There is no bowel dilatation to suggest obstruction. There are no
air-fluid levels. There is no evidence of pneumoperitoneum, portal
venous gas, or pneumatosis.

There is a levo scoliosis of the thoracolumbar spine.
IMPRESSION: Negative abdominal radiographs.  No acute cardiopulmonary disease.

## 2016-06-16 ENCOUNTER — Emergency Department: Payer: Medicare Other

## 2016-06-16 ENCOUNTER — Encounter: Payer: Self-pay | Admitting: Emergency Medicine

## 2016-06-16 ENCOUNTER — Inpatient Hospital Stay
Admission: EM | Admit: 2016-06-16 | Discharge: 2016-06-18 | DRG: 871 | Disposition: A | Discharge status: Hospice | Payer: Medicare Other | Attending: Internal Medicine | Admitting: Internal Medicine

## 2016-06-16 DIAGNOSIS — N179 Acute kidney failure, unspecified: Secondary | ICD-10-CM | POA: Diagnosis present

## 2016-06-16 DIAGNOSIS — Z515 Encounter for palliative care: Secondary | ICD-10-CM | POA: Diagnosis not present

## 2016-06-16 DIAGNOSIS — E871 Hypo-osmolality and hyponatremia: Secondary | ICD-10-CM | POA: Diagnosis present

## 2016-06-16 DIAGNOSIS — J189 Pneumonia, unspecified organism: Secondary | ICD-10-CM | POA: Diagnosis present

## 2016-06-16 DIAGNOSIS — Z9841 Cataract extraction status, right eye: Secondary | ICD-10-CM

## 2016-06-16 DIAGNOSIS — I2510 Atherosclerotic heart disease of native coronary artery without angina pectoris: Secondary | ICD-10-CM | POA: Diagnosis present

## 2016-06-16 DIAGNOSIS — N183 Chronic kidney disease, stage 3 (moderate): Secondary | ICD-10-CM | POA: Diagnosis present

## 2016-06-16 DIAGNOSIS — E876 Hypokalemia: Secondary | ICD-10-CM | POA: Diagnosis present

## 2016-06-16 DIAGNOSIS — R159 Full incontinence of feces: Secondary | ICD-10-CM | POA: Diagnosis present

## 2016-06-16 DIAGNOSIS — Z23 Encounter for immunization: Secondary | ICD-10-CM

## 2016-06-16 DIAGNOSIS — I248 Other forms of acute ischemic heart disease: Secondary | ICD-10-CM | POA: Diagnosis present

## 2016-06-16 DIAGNOSIS — I252 Old myocardial infarction: Secondary | ICD-10-CM

## 2016-06-16 DIAGNOSIS — E860 Dehydration: Secondary | ICD-10-CM | POA: Diagnosis present

## 2016-06-16 DIAGNOSIS — Z7189 Other specified counseling: Secondary | ICD-10-CM

## 2016-06-16 DIAGNOSIS — Z8249 Family history of ischemic heart disease and other diseases of the circulatory system: Secondary | ICD-10-CM | POA: Diagnosis not present

## 2016-06-16 DIAGNOSIS — Z87440 Personal history of urinary (tract) infections: Secondary | ICD-10-CM

## 2016-06-16 DIAGNOSIS — Z79891 Long term (current) use of opiate analgesic: Secondary | ICD-10-CM

## 2016-06-16 DIAGNOSIS — E46 Unspecified protein-calorie malnutrition: Secondary | ICD-10-CM | POA: Diagnosis present

## 2016-06-16 DIAGNOSIS — E870 Hyperosmolality and hypernatremia: Secondary | ICD-10-CM | POA: Diagnosis present

## 2016-06-16 DIAGNOSIS — G309 Alzheimer's disease, unspecified: Secondary | ICD-10-CM | POA: Diagnosis present

## 2016-06-16 DIAGNOSIS — Z888 Allergy status to other drugs, medicaments and biological substances status: Secondary | ICD-10-CM

## 2016-06-16 DIAGNOSIS — F0280 Dementia in other diseases classified elsewhere without behavioral disturbance: Secondary | ICD-10-CM

## 2016-06-16 DIAGNOSIS — Z66 Do not resuscitate: Secondary | ICD-10-CM | POA: Diagnosis present

## 2016-06-16 DIAGNOSIS — Z7951 Long term (current) use of inhaled steroids: Secondary | ICD-10-CM

## 2016-06-16 DIAGNOSIS — Z8673 Personal history of transient ischemic attack (TIA), and cerebral infarction without residual deficits: Secondary | ICD-10-CM | POA: Diagnosis not present

## 2016-06-16 DIAGNOSIS — Z79899 Other long term (current) drug therapy: Secondary | ICD-10-CM

## 2016-06-16 DIAGNOSIS — A419 Sepsis, unspecified organism: Secondary | ICD-10-CM | POA: Diagnosis not present

## 2016-06-16 DIAGNOSIS — R627 Adult failure to thrive: Secondary | ICD-10-CM | POA: Diagnosis present

## 2016-06-16 DIAGNOSIS — Z9842 Cataract extraction status, left eye: Secondary | ICD-10-CM

## 2016-06-16 DIAGNOSIS — Z7982 Long term (current) use of aspirin: Secondary | ICD-10-CM

## 2016-06-16 DIAGNOSIS — L8990 Pressure ulcer of unspecified site, unspecified stage: Secondary | ICD-10-CM | POA: Insufficient documentation

## 2016-06-16 DIAGNOSIS — Z90710 Acquired absence of both cervix and uterus: Secondary | ICD-10-CM | POA: Diagnosis not present

## 2016-06-16 DIAGNOSIS — Z681 Body mass index (BMI) 19 or less, adult: Secondary | ICD-10-CM | POA: Diagnosis not present

## 2016-06-16 DIAGNOSIS — Z87891 Personal history of nicotine dependence: Secondary | ICD-10-CM

## 2016-06-16 DIAGNOSIS — R6251 Failure to thrive (child): Secondary | ICD-10-CM

## 2016-06-16 LAB — COMPREHENSIVE METABOLIC PANEL
ALK PHOS: 154 U/L — AB (ref 38–126)
ALT: 22 U/L (ref 14–54)
ANION GAP: 9 (ref 5–15)
AST: 37 U/L (ref 15–41)
Albumin: 2.1 g/dL — ABNORMAL LOW (ref 3.5–5.0)
BILIRUBIN TOTAL: 0.7 mg/dL (ref 0.3–1.2)
BUN: 39 mg/dL — ABNORMAL HIGH (ref 6–20)
CALCIUM: 9.3 mg/dL (ref 8.9–10.3)
CO2: 26 mmol/L (ref 22–32)
Chloride: 123 mmol/L — ABNORMAL HIGH (ref 101–111)
Creatinine, Ser: 1.9 mg/dL — ABNORMAL HIGH (ref 0.44–1.00)
GFR calc non Af Amer: 23 mL/min — ABNORMAL LOW (ref >60)
GFR, EST AFRICAN AMERICAN: 27 mL/min — AB (ref >60)
Glucose, Bld: 119 mg/dL — ABNORMAL HIGH (ref 65–99)
Potassium: 3.8 mmol/L (ref 3.5–5.1)
SODIUM: 158 mmol/L — AB (ref 135–145)
TOTAL PROTEIN: 8.9 g/dL — AB (ref 6.5–8.1)

## 2016-06-16 LAB — CBC WITH DIFFERENTIAL/PLATELET
Basophils Absolute: 0 10*3/uL (ref 0–0.1)
Basophils Relative: 0 %
EOS ABS: 0.1 10*3/uL (ref 0–0.7)
Eosinophils Relative: 1 %
HCT: 36.9 % (ref 35.0–47.0)
HEMOGLOBIN: 11.5 g/dL — AB (ref 12.0–16.0)
LYMPHS ABS: 2.3 10*3/uL (ref 1.0–3.6)
Lymphocytes Relative: 14 %
MCH: 27.4 pg (ref 26.0–34.0)
MCHC: 31 g/dL — AB (ref 32.0–36.0)
MCV: 88.3 fL (ref 80.0–100.0)
MONOS PCT: 5 %
Monocytes Absolute: 0.8 10*3/uL (ref 0.2–0.9)
NEUTROS PCT: 80 %
Neutro Abs: 12.9 10*3/uL — ABNORMAL HIGH (ref 1.4–6.5)
Platelets: 403 10*3/uL (ref 150–440)
RBC: 4.18 MIL/uL (ref 3.80–5.20)
RDW: 17.6 % — ABNORMAL HIGH (ref 11.5–14.5)
WBC: 16.2 10*3/uL — ABNORMAL HIGH (ref 3.6–11.0)

## 2016-06-16 LAB — URINALYSIS, COMPLETE (UACMP) WITH MICROSCOPIC
BILIRUBIN URINE: NEGATIVE
GLUCOSE, UA: NEGATIVE mg/dL
Ketones, ur: NEGATIVE mg/dL
NITRITE: NEGATIVE
PH: 5 (ref 5.0–8.0)
Protein, ur: 30 mg/dL — AB
SPECIFIC GRAVITY, URINE: 1.019 (ref 1.005–1.030)
Squamous Epithelial / LPF: NONE SEEN

## 2016-06-16 LAB — TROPONIN I
TROPONIN I: 0.05 ng/mL — AB (ref <0.03)
TROPONIN I: 0.11 ng/mL — AB (ref <0.03)

## 2016-06-16 LAB — SODIUM: SODIUM: 159 mmol/L — AB (ref 135–145)

## 2016-06-16 LAB — RAPID INFLUENZA A&B ANTIGENS (ARMC ONLY): INFLUENZA A (ARMC): NEGATIVE

## 2016-06-16 LAB — RAPID INFLUENZA A&B ANTIGENS: Influenza B (ARMC): NEGATIVE

## 2016-06-16 MED ORDER — ONDANSETRON HCL 4 MG/2ML IJ SOLN: 4.0000 mg | Freq: Four times a day (QID) | INTRAMUSCULAR | Status: DC | PRN

## 2016-06-16 MED ORDER — VITAMIN B-12 1000 MCG PO TABS
1000.0000 ug | ORAL_TABLET | Freq: Every day | ORAL | Status: DC
  Administered 2016-06-16 – 2016-06-17 (×2): 1000 ug via ORAL
  Filled 2016-06-16 (×2): qty 1

## 2016-06-16 MED ORDER — INFLUENZA VAC SPLIT QUAD 0.5 ML IM SUSY
0.5000 mL | PREFILLED_SYRINGE | INTRAMUSCULAR | Status: AC
  Administered 2016-06-17: 0.5 mL via INTRAMUSCULAR
  Filled 2016-06-16: qty 0.5

## 2016-06-16 MED ORDER — ATORVASTATIN CALCIUM 20 MG PO TABS
20.0000 mg | ORAL_TABLET | Freq: Every day | ORAL | Status: DC
  Administered 2016-06-17: 08:00:00 20 mg via ORAL
  Filled 2016-06-16: qty 1

## 2016-06-16 MED ORDER — HEPARIN SODIUM (PORCINE) 5000 UNIT/ML IJ SOLN
5000.0000 [IU] | Freq: Three times a day (TID) | INTRAMUSCULAR | Status: DC
  Administered 2016-06-16 – 2016-06-17 (×2): 5000 [IU] via SUBCUTANEOUS
  Filled 2016-06-16 (×2): qty 1

## 2016-06-16 MED ORDER — ORAL CARE MOUTH RINSE
15.0000 mL | Freq: Two times a day (BID) | OROMUCOSAL | Status: DC
  Administered 2016-06-16 – 2016-06-17 (×2): 15 mL via OROMUCOSAL

## 2016-06-16 MED ORDER — IBUPROFEN 200 MG PO TABS
200.0000 mg | ORAL_TABLET | Freq: Four times a day (QID) | ORAL | Status: DC | PRN
  Filled 2016-06-16: qty 1

## 2016-06-16 MED ORDER — SODIUM CHLORIDE 0.9 % IV BOLUS (SEPSIS)
1000.0000 mL | Freq: Once | INTRAVENOUS | Status: AC
  Administered 2016-06-16: 1000 mL via INTRAVENOUS

## 2016-06-16 MED ORDER — ACETAMINOPHEN 325 MG PO TABS: 650.0000 mg | ORAL_TABLET | Freq: Four times a day (QID) | ORAL | Status: DC | PRN

## 2016-06-16 MED ORDER — ONDANSETRON HCL 4 MG PO TABS: 4.0000 mg | ORAL_TABLET | Freq: Four times a day (QID) | ORAL | Status: DC | PRN

## 2016-06-16 MED ORDER — DEXTROSE 5 % IV SOLN
1.0000 g | INTRAVENOUS | Status: DC
  Filled 2016-06-16: qty 10

## 2016-06-16 MED ORDER — BISACODYL 5 MG PO TBEC: 5.0000 mg | DELAYED_RELEASE_TABLET | Freq: Every day | ORAL | Status: DC | PRN

## 2016-06-16 MED ORDER — LEVOFLOXACIN IN D5W 750 MG/150ML IV SOLN
750.0000 mg | Freq: Once | INTRAVENOUS | Status: AC
  Administered 2016-06-16: 750 mg via INTRAVENOUS
  Filled 2016-06-16: qty 150

## 2016-06-16 MED ORDER — DONEPEZIL HCL 5 MG PO TABS
10.0000 mg | ORAL_TABLET | Freq: Every day | ORAL | Status: DC
  Administered 2016-06-16: 22:00:00 10 mg via ORAL
  Filled 2016-06-16: qty 2

## 2016-06-16 MED ORDER — ALBUTEROL SULFATE HFA 108 (90 BASE) MCG/ACT IN AERS: 2.0000 | INHALATION_SPRAY | RESPIRATORY_TRACT | Status: DC | PRN

## 2016-06-16 MED ORDER — ENOXAPARIN SODIUM 40 MG/0.4ML ~~LOC~~ SOLN: 40.0000 mg | SUBCUTANEOUS | Status: DC

## 2016-06-16 MED ORDER — LEVOFLOXACIN IN D5W 750 MG/150ML IV SOLN: 750.0000 mg | Freq: Once | INTRAVENOUS | Status: DC

## 2016-06-16 MED ORDER — ASPIRIN EC 81 MG PO TBEC
162.0000 mg | DELAYED_RELEASE_TABLET | Freq: Every day | ORAL | Status: DC
  Administered 2016-06-17: 162 mg via ORAL
  Filled 2016-06-16: qty 2

## 2016-06-16 MED ORDER — SENNOSIDES-DOCUSATE SODIUM 8.6-50 MG PO TABS: 1.0000 | ORAL_TABLET | Freq: Every evening | ORAL | Status: DC | PRN

## 2016-06-16 MED ORDER — ALBUTEROL SULFATE (2.5 MG/3ML) 0.083% IN NEBU: 2.5000 mg | INHALATION_SOLUTION | Freq: Four times a day (QID) | RESPIRATORY_TRACT | Status: DC | PRN

## 2016-06-16 MED ORDER — DEXTROSE 5 % IV SOLN
500.0000 mg | INTRAVENOUS | Status: DC
  Filled 2016-06-16: qty 500

## 2016-06-16 MED ORDER — MEMANTINE HCL 10 MG PO TABS
10.0000 mg | ORAL_TABLET | Freq: Two times a day (BID) | ORAL | Status: DC
  Administered 2016-06-16 – 2016-06-17 (×2): 10 mg via ORAL
  Filled 2016-06-16 (×2): qty 1

## 2016-06-16 MED ORDER — TIOTROPIUM BROMIDE MONOHYDRATE 18 MCG IN CAPS
18.0000 ug | ORAL_CAPSULE | Freq: Every day | RESPIRATORY_TRACT | Status: DC
  Administered 2016-06-17: 08:00:00 18 ug via RESPIRATORY_TRACT
  Filled 2016-06-16: qty 5

## 2016-06-16 MED ORDER — CITALOPRAM HYDROBROMIDE 20 MG PO TABS
20.0000 mg | ORAL_TABLET | Freq: Every day | ORAL | Status: DC
  Administered 2016-06-17: 20 mg via ORAL
  Filled 2016-06-16: qty 1

## 2016-06-16 MED ORDER — ACETAMINOPHEN 650 MG RE SUPP: 650.0000 mg | Freq: Four times a day (QID) | RECTAL | Status: DC | PRN

## 2016-06-16 MED ORDER — DEXTROSE 5 % IV SOLN
INTRAVENOUS | Status: DC
  Administered 2016-06-16 – 2016-06-17 (×2): via INTRAVENOUS

## 2016-06-16 NOTE — Progress Notes (Signed)
Dr. Judithann SheenSparks notified of sodium 159 and troponin 0.11 and reviewed previous levels. Cardiology consult ordered. Communicated this to oncoming nurse. Pt resting quietly with eyes closed.

## 2016-06-16 NOTE — Progress Notes (Signed)
Family Meeting Note  Advance Directive:no  Today a meeting took place with the daughter and caregiver  Patient is unable to participate due ZO:[id number]:Lacked capacity severe dementia   The following clinical team members were present during this meeting:MD  The following were discussed:Patient's diagnosis: Advanced dementia which is progressing Sepsis from pneumonia Hypernatremia Poor by mouth intake due to advanced dementia   Patient's progosis: < 6 months and Goals for treatment: DNR  Additional follow-up to be provided: Positive care and hospice referral  Time spent during discussion:22 minutes  Kamari Bilek, Patricia PesaSITAL, MD

## 2016-06-16 NOTE — Progress Notes (Signed)
Pt has a CrCl of 13.9 ml/min using a height of 5'6" in care everywhere. Therefore pt was transitioned from lovenox to SubQ heparin per protocol.  Olene FlossMelissa D Jewel Mcafee, Pharm.D, BCPS Clinical Pharmacist

## 2016-06-16 NOTE — Progress Notes (Signed)
Pt admitted from the ED accompanied by 2 sister in laws who care for her. Report decline in that pt has become bedbound, not eating/drinking and concerned she has an infection. Family requested pt to be DNR with MD contacted with order obtained. Pressure ulcer, skin tear care performed. IVF's started and infusing readily with site wrapped with loose coban. Pt has not voided since admission. Family asking for care coordination to discuss increase homecare support. Pt resting quietly. She is nonverbal.

## 2016-06-16 NOTE — H&P (Signed)
[facility] [place] [facility]   PATIENT NAME: Kathleen Graham    MR#:  [id number]  DATE OF BIRTH:  01-10-1932  DATE OF ADMISSION:  06/16/2016  PRIMARY CARE PHYSICIAN: Ronal FearLynn E Lam, NP   REQUESTING/REFERRING PHYSICIAN: dr Derrill Kaygoodman  CHIEF COMPLAINT:    Decreased appetite HISTORY OF PRESENT ILLNESS:  Kathleen Graham  is a 83 y.o. female with a known history of Stage IV Alzheimer's dementia who presents with family due to above complaint. Caregiver reports over the past 1-1/2 months patient has shown significant decline from Alzheimer's dementia. She was once able to ambulate and feed herself however she is unable to perform any ADLs. Over the past 10 days she has had very minimal oral intake. They were concerned so brought her to the hospital for further evaluation. Caregiver also reports patient has had a cough over the past 8-10 days as well.    PAST MEDICAL HISTORY:   Past Medical History:  Diagnosis Date  . Abnormality of gait 11/19/2012  . Alzheimer's dementia    stage 4  . Backache, unspecified   . Chronic kidney disease, stage III (moderate) 12/01/2007  . Coronary atherosclerosis of unspecified type of vessel, native or graft   . Cystocele, midline   . Essential hypertension, benign   . Full incontinence of feces   . History of acute myocardial infarction   . History of TIA (transient ischemic attack) 2005  . Hyperuricemia    Due to HCTZ  . Insomnia, unspecified   . Lipoprotein deficiencies   . Memory deficits 11/19/2012  . Mixed hyperlipidemia   . Osteoporosis, unspecified 07/03/12   Hip and foremar by DEXA  . Other B-complex deficiencies   . Other persistent mental disorders due to conditions classified elsewhere 08-2007  . 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 Tobacco use disorder 09/01/2007  . Unspecified cataract   . Unspecified urinary incontinence     PAST SURGICAL HISTORY:   Past Surgical History:  Procedure Laterality  Date  . ABDOMINAL HYSTERECTOMY    . CATARACT EXTRACTION Bilateral     SOCIAL HISTORY:   Social History  Substance Use Topics  . Smoking status: Former Smoker    Quit date: 06/11/2011  . Smokeless tobacco: Never Used  . Alcohol use No    FAMILY HISTORY:   Family History  Problem Relation Age of Onset  . Heart attack Son     DRUG ALLERGIES:   Allergies  Allergen Reactions  . Hydrochlorothiazide     Hyperurlcemia  . Tramadol     Leg weakness    REVIEW OF SYSTEMS:   Review of Systems  Unable to perform ROS: Dementia    MEDICATIONS AT HOME:   Prior to Admission medications   Medication Sig Start Date End Date Taking? Authorizing Provider  acetaminophen (TYLENOL) 500 MG tablet Take 2 tablets (1,000 mg total) by mouth 3 (three) times daily as needed. 07/10/13  Yes Genelle GatherKathryn F Glenn, MD  albuterol (PROVENTIL HFA;VENTOLIN HFA) 108 (90 BASE) MCG/ACT inhaler Inhale 2 puffs into the lungs every 4 (four) hours as needed for wheezing or shortness of breath.   Yes Historical Provider, MD  albuterol (PROVENTIL) (2.5 MG/3ML) 0.083% nebulizer solution Take 2.5 mg by nebulization every 6 (six) hours as needed for wheezing.   Yes Historical Provider, MD  aspirin 81 MG tablet Take 162 mg by mouth daily.    Yes Historical Provider, MD  atorvastatin (LIPITOR) 20 MG tablet Take 20  mg by mouth daily.   Yes Historical Provider, MD  citalopram (CELEXA) 20 MG tablet Take 20 mg by mouth daily. 11/07/12  Yes Historical Provider, MD  donepezil (ARICEPT) 10 MG tablet Take 10 mg by mouth at bedtime.   Yes Historical Provider, MD  HYDROcodone-acetaminophen (NORCO/VICODIN) 5-325 MG tablet Take 1 tablet by mouth daily as needed. 05/29/16  Yes Historical Provider, MD  memantine (NAMENDA) 10 MG tablet Take 10 mg by mouth 2 (two) times daily.   Yes Historical Provider, MD  tiotropium (SPIRIVA) 18 MCG inhalation capsule Place 18 mcg into inhaler and inhale daily.   Yes Historical Provider, MD  vitamin B-12  (CYANOCOBALAMIN) 1000 MCG tablet Take 1,000 mcg by mouth daily.   Yes Historical Provider, MD      VITAL SIGNS:  Blood pressure 113/70, pulse (!) 111, temperature 98.8 F (37.1 C), temperature source Rectal, resp. rate (!) 30, weight 38.6 kg (85 lb), SpO2 100 %.  PHYSICAL EXAMINATION:   Physical Exam  Constitutional: No distress.  Frail and weak  HENT:  Head: Normocephalic.  Eyes: No scleral icterus.  Neck: Normal range of motion. Neck supple. No JVD present. No tracheal deviation present.  Cardiovascular: Regular rhythm and normal heart sounds.  Exam reveals no gallop and no friction rub.   No murmur heard. tachycardia  Pulmonary/Chest: Effort normal and breath sounds normal. No respiratory distress. She has no wheezes. She has no rales. She exhibits no tenderness.  Abdominal: Soft. Bowel sounds are normal. She exhibits no distension and no mass. There is no tenderness. There is no rebound and no guarding.  Musculoskeletal: Normal range of motion. She exhibits no edema.  Neurological: She is alert.  Skin: Skin is warm. No rash noted. No erythema.      LABORATORY PANEL:   CBC  Recent Labs Lab 06/16/16 1351  WBC 16.2*  HGB 11.5*  HCT 36.9  PLT 403   ------------------------------------------------------------------------------------------------------------------  Chemistries   Recent Labs Lab 06/16/16 1351  NA 158*  K 3.8  CL 123*  CO2 26  GLUCOSE 119*  BUN 39*  CREATININE 1.90*  CALCIUM 9.3  AST 37  ALT 22  ALKPHOS 154*  BILITOT 0.7   ------------------------------------------------------------------------------------------------------------------  Cardiac Enzymes  Recent Labs Lab 06/16/16 1351  TROPONINI 0.05*   ------------------------------------------------------------------------------------------------------------------  RADIOLOGY:  Dg Chest 2 View  Result Date: 06/16/2016 CLINICAL DATA:  Cough and congestion for several weeks EXAM:  CHEST  2 VIEW COMPARISON:  05/07/2015 FINDINGS: Cardiac shadow is stable. Aortic calcifications are noted. Fibrotic changes are noted throughout both lungs which are chronic in nature. No focal infiltrate or sizable effusion is seen. No acute bony abnormality is noted. IMPRESSION: Chronic fibrotic changes without acute abnormality. Electronically Signed   By: Alcide Clever M.D.   On: 06/16/2016 14:37    EKG:   Sinus tachycardia with left bundle branch block  IMPRESSION AND PLAN:    83 year old female with stage IV Alzheimer's dementia who presents with decreased appetite found to have hypernatremia and acute kidney injury.  1. Hypernatremia: This is due to poor by mouth intake and advanced dementia Start D5W Monitor sodium level every 6 hours.. Do not want to decrease sodium level too quickly Nephrology consult   2 Sepsis with leukocytosis, tachycardia and tachypnea: Although chest x-ray does not show pneumonia I am concerned patient may have pneumonia due to her cough and on my read of chest x-ray does appear that on the left side she may have some infiltrate. Start Levaquin Follow  up on blood cultures Follow up on influenza testing. Chest x-ray repeated for a.m.   3 Acute kidney injury in the setting of poor by mouth intake: Continue fluids and repeat BMP in a.m.  4. Advanced dementia: I had a discussion with the patient's family about her advanced dementia. Case management with hospice and palliative care referral  5. Failure to thrive, adult due to dementia and protein calorie malnutrition: Dietary consult  6 Elevated troponin in the setting of demand ischemia and acute kidney injury with poor renal clearance   All the records are reviewed and case discussed with ED provider. Management plans discussed with the patient's family and they are in agreement  CODE STATUS: DNR  TOTAL TIME TAKING CARE OF THIS PATIENT: 45 minutes.    Eirene Rather M.D on 06/16/2016 at 2:51  PM  Between 7am to 6pm - Pager - [phone]  After 6pm go to [email or web address] - password [email or web address]  [facility]  Office  [phone]  CC: Primary care physician; Ronal Fear, NP    so

## 2016-06-16 NOTE — ED Notes (Signed)
critical troponin 0.05 reported to Derrill KayGoodman, MD

## 2016-06-16 NOTE — ED Provider Notes (Signed)
[facility] Emergency Department Provider Note   ____________________________________________   I have reviewed the triage vital signs and the nursing notes.   HISTORY  Chief Complaint not eating   History limited by: dementia, history obtained from caregiver   HPI Kathleen Graham is a 83 y.o. female who presents to the emergency department today because of caregivers concern for decreased oral intake. The caregiver states that the patient has not had a great appetite for a long time however for the past week or so will not eat any solid foods. Additionally there is concern for dehydration. Patient has been urinating less than normal. The patient does have a distant history of dementia. Caregivers also concerned that she has had some bad sounds in her lungs. Caregivers concerned she might have pneumonia. Patient also has history of frequent UTIs although has not had one recently. No recent fevers.   Past Medical History:  Diagnosis Date  . Abnormality of gait 11/19/2012  . Alzheimer's dementia    stage 4  . Backache, unspecified   . Chronic kidney disease, stage III (moderate) 12/01/2007  . Coronary atherosclerosis of unspecified type of vessel, native or graft   . Cystocele, midline   . Essential hypertension, benign   . Full incontinence of feces   . History of acute myocardial infarction   . History of TIA (transient ischemic attack) 2005  . Hyperuricemia    Due to HCTZ  . Insomnia, unspecified   . Lipoprotein deficiencies   . Memory deficits 11/19/2012  . Mixed hyperlipidemia   . Osteoporosis, unspecified 07/03/12   Hip and foremar by DEXA  . Other B-complex deficiencies   . Other persistent mental disorders due to conditions classified elsewhere 08-2007  . 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 Tobacco use disorder 09/01/2007  . Unspecified cataract   . Unspecified urinary incontinence     Patient Active Problem List   Diagnosis Date Noted  . Left lower lobe pneumonia (HCC) 05/07/2015  . CKD (chronic kidney disease), stage III 05/07/2015  . ICAO (internal carotid artery occlusion) 05/07/2015  . Dysphagia 05/07/2015  . UTI (urinary tract infection) 05/04/2015  . Sepsis (HCC) 05/04/2015  . COPD (chronic obstructive pulmonary disease) (HCC) 07/08/2013  . Other and unspecified hyperlipidemia 07/08/2013  . Osteoporosis, unspecified 07/08/2013  . History of myocardial infarct at age greater than 60 years 07/08/2013  . Syncope 07/07/2013  . Memory deficits 11/19/2012  . Abnormality of gait 11/19/2012    Past Surgical History:  Procedure Laterality Date  . ABDOMINAL HYSTERECTOMY    . CATARACT EXTRACTION Bilateral     Prior to Admission medications   Medication Sig Start Date End Date Taking? Authorizing Provider  acetaminophen (TYLENOL) 500 MG tablet Take 2 tablets (1,000 mg total) by mouth 3 (three) times daily as needed. Patient not taking: Reported on 05/03/2015 07/10/13   Genelle GatherKathryn F Glenn, MD  albuterol (PROVENTIL HFA;VENTOLIN HFA) 108 (90 BASE) MCG/ACT inhaler Inhale 2 puffs into the lungs every 4 (four) hours as needed for wheezing or shortness of breath.    Historical Provider, MD  albuterol (PROVENTIL) (2.5 MG/3ML) 0.083% nebulizer solution Take 2.5 mg by nebulization every 6 (six) hours as needed for wheezing.    Historical Provider, MD  aspirin 81 MG tablet Take 162 mg by mouth daily.     Historical Provider, MD  azithromycin (ZITHROMAX) 250 MG tablet Take 1 tablet (250 mg total) by mouth daily. 05/07/15   Katharina Caperima Vaickute,  MD  cefdinir (OMNICEF) 300 MG capsule Take 1 capsule (300 mg total) by mouth 2 (two) times daily. 05/07/15   Katharina Caper, MD  citalopram (CELEXA) 20 MG tablet Take 20 mg by mouth daily. 11/07/12   Historical Provider, MD  tiotropium (SPIRIVA) 18 MCG inhalation capsule Place 18 mcg into inhaler and inhale daily.    Historical Provider, MD  vitamin B-12 (CYANOCOBALAMIN) 1000 MCG  tablet Take 1,000 mcg by mouth daily.    Historical Provider, MD    Allergies Hydrochlorothiazide and Tramadol  Family History  Problem Relation Age of Onset  . Heart attack Son     Social History Social History  Substance Use Topics  . Smoking status: Former Smoker    Quit date: 06/11/2011  . Smokeless tobacco: Never Used  . Alcohol use No    Review of Systems Unable to obtain secondary to dementia  ____________________________________________   PHYSICAL EXAM:  VITAL SIGNS: ED Triage Vitals  Enc Vitals Group     BP 06/16/16 1344 113/70     Pulse Rate 06/16/16 1344 (!) 102     Resp 06/16/16 1344 (!) 24     Temp --      Temp src --      SpO2 --      Weight 06/16/16 1341 85 lb (38.6 kg)   Constitutional: Awake and alert. Chronically ill appearing, cachectic.  Eyes: Conjunctivae are normal. Normal extraocular movements. ENT   Head: Normocephalic and atraumatic.   Nose: No congestion/rhinnorhea.   Mouth/Throat: Mucous membranes are moist.   Neck: No stridor. Hematological/Lymphatic/Immunilogical: No cervical lymphadenopathy. Cardiovascular: Tachycardic, regular rhythm.  No murmurs, rubs, or gallops.  Respiratory: Normal respiratory effort without tachypnea nor retractions. Breath sounds are clear and equal bilaterally. No wheezes/rales/rhonchi. Gastrointestinal: Soft and non tender. No rebound. No guarding.  Genitourinary: Deferred Musculoskeletal: Normal range of motion in all extremities. No lower extremity edema. Neurologic:  Awake, alert, not oriented.  Moving all extremities. Sensation grossly intact.  Skin:  Skin is warm, dry and intact. No rash noted.   ____________________________________________    LABS (pertinent positives/negatives)  Labs Reviewed  URINALYSIS, COMPLETE (UACMP) WITH MICROSCOPIC - Abnormal; Notable for the following:       Result Value   Color, Urine YELLOW (*)    APPearance HAZY (*)    Hgb urine dipstick SMALL (*)     Protein, ur 30 (*)    Leukocytes, UA TRACE (*)    Bacteria, UA MANY (*)    All other components within normal limits  CBC WITH DIFFERENTIAL/PLATELET - Abnormal; Notable for the following:    WBC 16.2 (*)    Hemoglobin 11.5 (*)    MCHC 31.0 (*)    RDW 17.6 (*)    Neutro Abs 12.9 (*)    All other components within normal limits  COMPREHENSIVE METABOLIC PANEL - Abnormal; Notable for the following:    Sodium 158 (*)    Chloride 123 (*)    Glucose, Bld 119 (*)    BUN 39 (*)    Creatinine, Ser 1.90 (*)    Total Protein 8.9 (*)    Albumin 2.1 (*)    Alkaline Phosphatase 154 (*)    GFR calc non Af Amer 23 (*)    GFR calc Af Amer 27 (*)    All other components within normal limits  TROPONIN I - Abnormal; Notable for the following:    Troponin I 0.05 (*)    All other components within normal limits  ____________________________________________   EKG  IPhineas Semen, attending physician, personally viewed and interpreted this EKG  EKG Time: 1345 Rate: 111 Rhythm: sinus tachycardia Axis: left axis deviation Intervals: qtc 486 QRS: LBBB ST changes: no st elevation Impression: abnormal ekg   ____________________________________________    RADIOLOGY  None  ____________________________________________   PROCEDURES  Procedures  ____________________________________________   INITIAL IMPRESSION / ASSESSMENT AND PLAN / ED COURSE  Pertinent labs & imaging results that were available during my care of the patient were reviewed by me and considered in my medical decision making (see chart for details).  Patient presented to the emergency department brought in by caregiver because of concerns for failure to thrive and decreased oral intake. On exam patient is somewhat chronically ill-appearing with cachectic features. Blood work was concerning for hypernatremia as well as elevated white count. Chest x-ray was concerning for possible pneumonia per my read. Patient  will be admitted to the hospitalist service.  ____________________________________________   FINAL CLINICAL IMPRESSION(S) / ED DIAGNOSES  Final diagnoses:  Hypernatremia  Failure to thrive (0-17)  Dehydration     Note: This dictation was prepared with Dragon dictation. Any transcriptional errors that result from this process are unintentional     Phineas Semen, MD 06/16/16 1437

## 2016-06-16 NOTE — Consult Note (Signed)
Pharmacy Antibiotic Note  Kathleen Graham is a 83 y.o. female admitted on 06/16/2016 with pneumonia.  Pharmacy has been consulted for ceftriaxone dosing. Pt also on azithromycin. One dose of levofloxacin was given in the ED  Plan: ceftriaxone 1g q 24hr. Will start tomorrow evening along with azithromycin since ot recieved 750mg  of levoflxoacin this evening  Weight: 85 lb (38.6 kg)  Temp (24hrs), Avg:98.8 F (37.1 C), Min:98.8 F (37.1 C), Max:98.8 F (37.1 C)   Recent Labs Lab 06/16/16 1351  WBC 16.2*  CREATININE 1.90*    CrCl cannot be calculated (Unknown ideal weight.).    Allergies  Allergen Reactions  . Hydrochlorothiazide     Hyperurlcemia  . Tramadol     Leg weakness    Antimicrobials this admission: levofloxacin 1/7 >> 1/7 ceftriaxone 1/8 >>  azithromycin 1/8>>  Dose adjustments this admission:   Microbiology results: 1/7 BCx:  1/7 flu:  Thank you for allowing pharmacy to be a part of this patient's care.  Olene FlossMelissa D Bell Cai, Pharm.D, BCPS Clinical Pharmacist  06/16/2016 6:16 PM

## 2016-06-16 NOTE — ED Triage Notes (Signed)
Daughter states patient has end stage alzheimer's dementia and has not taken intake orally for about 10 days.  Daughter states she has been able to feed patient ensure with a syringe.  Daughter states she would consider an NGT for nutrition if needed.

## 2016-06-17 ENCOUNTER — Inpatient Hospital Stay: Payer: Medicare Other

## 2016-06-17 DIAGNOSIS — Z515 Encounter for palliative care: Secondary | ICD-10-CM

## 2016-06-17 DIAGNOSIS — E860 Dehydration: Secondary | ICD-10-CM

## 2016-06-17 DIAGNOSIS — G309 Alzheimer's disease, unspecified: Secondary | ICD-10-CM

## 2016-06-17 DIAGNOSIS — L8990 Pressure ulcer of unspecified site, unspecified stage: Secondary | ICD-10-CM | POA: Insufficient documentation

## 2016-06-17 DIAGNOSIS — Z7189 Other specified counseling: Secondary | ICD-10-CM

## 2016-06-17 DIAGNOSIS — F0280 Dementia in other diseases classified elsewhere without behavioral disturbance: Secondary | ICD-10-CM

## 2016-06-17 LAB — SODIUM
SODIUM: 147 mmol/L — AB (ref 135–145)
SODIUM: 148 mmol/L — AB (ref 135–145)
Sodium: 147 mmol/L — ABNORMAL HIGH (ref 135–145)

## 2016-06-17 LAB — BASIC METABOLIC PANEL
Anion gap: 4 — ABNORMAL LOW (ref 5–15)
BUN: 31 mg/dL — AB (ref 6–20)
CHLORIDE: 120 mmol/L — AB (ref 101–111)
CO2: 23 mmol/L (ref 22–32)
CREATININE: 1.42 mg/dL — AB (ref 0.44–1.00)
Calcium: 7.9 mg/dL — ABNORMAL LOW (ref 8.9–10.3)
GFR calc Af Amer: 39 mL/min — ABNORMAL LOW (ref >60)
GFR calc non Af Amer: 33 mL/min — ABNORMAL LOW (ref >60)
Glucose, Bld: 107 mg/dL — ABNORMAL HIGH (ref 65–99)
Potassium: 3.2 mmol/L — ABNORMAL LOW (ref 3.5–5.1)
Sodium: 147 mmol/L — ABNORMAL HIGH (ref 135–145)

## 2016-06-17 LAB — CBC
HCT: 26.9 % — ABNORMAL LOW (ref 35.0–47.0)
Hemoglobin: 8.7 g/dL — ABNORMAL LOW (ref 12.0–16.0)
MCH: 28.3 pg (ref 26.0–34.0)
MCHC: 32.3 g/dL (ref 32.0–36.0)
MCV: 87.3 fL (ref 80.0–100.0)
PLATELETS: 240 10*3/uL (ref 150–440)
RBC: 3.08 MIL/uL — ABNORMAL LOW (ref 3.80–5.20)
RDW: 17.5 % — AB (ref 11.5–14.5)
WBC: 9.7 10*3/uL (ref 3.6–11.0)

## 2016-06-17 LAB — TROPONIN I
Troponin I: 0.05 ng/mL (ref <0.03)
Troponin I: 0.13 ng/mL (ref <0.03)

## 2016-06-17 MED ORDER — MORPHINE SULFATE (CONCENTRATE) 10 MG/0.5ML PO SOLN: 5.0000 mg | ORAL | Status: DC | PRN

## 2016-06-17 MED ORDER — ENSURE ENLIVE PO LIQD: 237.0000 mL | ORAL | Status: DC | PRN

## 2016-06-17 MED ORDER — CEFTRIAXONE SODIUM-DEXTROSE 1-3.74 GM-% IV SOLR: 1.0000 g | Freq: Every day | INTRAVENOUS | Status: DC

## 2016-06-17 MED ORDER — LORAZEPAM 0.5 MG PO TABS: 0.5000 mg | ORAL_TABLET | Freq: Four times a day (QID) | ORAL | Status: DC | PRN

## 2016-06-17 MED ORDER — ENOXAPARIN SODIUM 30 MG/0.3ML ~~LOC~~ SOLN: 30.0000 mg | SUBCUTANEOUS | Status: DC

## 2016-06-17 NOTE — Consult Note (Signed)
Consultation Note Date: 06/17/2016   Patient Name: Kathleen Graham  DOB: 12-20-1931  MRN: [id number]  Age / Sex: 83 y.o., female  PCP: Philmore Pali, NP Referring Physician: Fritzi Mandes, MD  Reason for Consultation: Establishing goals of care  HPI/Patient Profile: 83 y.o. female  with past medical history of Alz dementia  admitted on 06/16/2016 with cough, decreased po intake. Workup reveals FTT, hypernatremia, AKI, possible pneumonia, elevated troponin. Palliative medicine consulted for [place].    Clinical Assessment and Goals of Care: Evaluated patient at bedside. She is mostly nonverbal, but did reply "I love you" when daughter said "I love you. She will accept food into her mouth, but does not swallow it. She does accept and swallow liquids. She is somnolent. Follows some commands. She cannot hold herself upright and must be propped up with pillows. She lives with her son and daughter in [employer]. Daughter, Barnetta Chapel is at bedside and states that her sister in law needs help at home.   Met later with daughter in law- Sharyn Lull, dtr Barnetta Chapel and mother in law- Rose. Discussed trajectory of dementia- noted patient's significant declines to the point she is now at end stage dementia and she is dying. She is sleeping more than she is awake, not really eating very much. All agreed to transition to comfort care and proceed home with hospice. Discussed that focus of comfort care is patient's comfort and dignity, with goal of not returning to hospital for infections or dehydration. Discussed residential vs. Home and they prefer to take patient home. Sharyn Lull has been caring for patient for several years and feels that with the support of Hospice she can continue to care for patient until end of life.  Primary Decision Maker NEXT OF KIN - daughter Barnetta Chapel, sister in law- Sharyn Lull    SUMMARY OF RECOMMENDATIONS -I have contacted  Sharyn Lull to try and arrange a family meeting time- hopeful for today, but may be tomorrow -I anticipate recommending Hospice care d/t patient's advanced dementia    Code Status/Advance Care Planning:  DNR  Palliative Prophylaxis:   Delirium Protocol  Additional Recommendations (Limitations, Scope, Preferences):  To be determined at family meeting  Psycho-social/Spiritual:   Desire for further Chaplaincy support:No  Additional Recommendations: Education on Hospice  Prognosis:    < 2 weeks d/t advanced dementia, functional decline, very little po intake, transition to comfort care, PPS: 20%  Discharge Planning: To Be Determined  Primary Diagnoses: Present on Admission: . Hypernatremia   I have reviewed the medical record, interviewed the patient and family, and examined the patient. The following aspects are pertinent.  Past Medical History:  Diagnosis Date  . Abnormality of gait 11/19/2012  . Alzheimer's dementia    stage 4  . Backache, unspecified   . Chronic kidney disease, stage III (moderate) 12/01/2007  . Coronary atherosclerosis of unspecified type of vessel, native or graft   . Cystocele, midline   . Essential hypertension, benign   . Full incontinence of feces   . History of acute myocardial  infarction   . History of TIA (transient ischemic attack) 2005  . Hyperuricemia    Due to HCTZ  . Insomnia, unspecified   . Lipoprotein deficiencies   . Memory deficits 11/19/2012  . Mixed hyperlipidemia   . Osteoporosis, unspecified 07/03/12   Hip and foremar by DEXA  . Other B-complex deficiencies   . Other persistent mental disorders due to conditions classified elsewhere 08-2007  . 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 Tobacco use disorder 09/01/2007  . Unspecified cataract   . Unspecified urinary incontinence    Social History   Social History  . Marital status: Widowed    Spouse name: N/A  . Number of children: 7  . Years of  education: 10   Social History Main Topics  . Smoking status: Former Smoker    Quit date: 06/11/2011  . Smokeless tobacco: Never Used  . Alcohol use No  . Drug use: No  . Sexual activity: Not Asked   Other Topics Concern  . None   Social History Narrative  . None   Family History  Problem Relation Age of Onset  . Heart attack Son    Scheduled Meds: . aspirin EC  162 mg Oral Daily  . atorvastatin  20 mg Oral Daily  . azithromycin  500 mg Intravenous Q24H  . cefTRIAXone  1 g Intravenous q1800  . citalopram  20 mg Oral Daily  . donepezil  10 mg Oral QHS  . heparin subcutaneous  5,000 Units Subcutaneous Q8H  . mouth rinse  15 mL Mouth Rinse BID  . memantine  10 mg Oral BID  . tiotropium  18 mcg Inhalation Daily  . vitamin B-12  1,000 mcg Oral Daily   Continuous Infusions: . dextrose 75 mL/hr at 06/17/16 0705   PRN Meds:.acetaminophen **OR** acetaminophen, albuterol, bisacodyl, ibuprofen, ondansetron **OR** ondansetron (ZOFRAN) IV, senna-docusate Medications Prior to Admission:  Prior to Admission medications   Medication Sig Start Date End Date Taking? Authorizing Provider  acetaminophen (TYLENOL) 500 MG tablet Take 2 tablets (1,000 mg total) by mouth 3 (three) times daily as needed. 07/10/13  Yes Otho Bellows, MD  albuterol (PROVENTIL HFA;VENTOLIN HFA) 108 (90 BASE) MCG/ACT inhaler Inhale 2 puffs into the lungs every 4 (four) hours as needed for wheezing or shortness of breath.   Yes Historical Provider, MD  albuterol (PROVENTIL) (2.5 MG/3ML) 0.083% nebulizer solution Take 2.5 mg by nebulization every 6 (six) hours as needed for wheezing.   Yes Historical Provider, MD  aspirin 81 MG tablet Take 162 mg by mouth daily.    Yes Historical Provider, MD  atorvastatin (LIPITOR) 20 MG tablet Take 20 mg by mouth daily.   Yes Historical Provider, MD  citalopram (CELEXA) 20 MG tablet Take 20 mg by mouth daily. 11/07/12  Yes Historical Provider, MD  donepezil (ARICEPT) 10 MG tablet Take  10 mg by mouth at bedtime.   Yes Historical Provider, MD  HYDROcodone-acetaminophen (NORCO/VICODIN) 5-325 MG tablet Take 1 tablet by mouth daily as needed. 05/29/16  Yes Historical Provider, MD  memantine (NAMENDA) 10 MG tablet Take 10 mg by mouth 2 (two) times daily.   Yes Historical Provider, MD  tiotropium (SPIRIVA) 18 MCG inhalation capsule Place 18 mcg into inhaler and inhale daily.   Yes Historical Provider, MD  vitamin B-12 (CYANOCOBALAMIN) 1000 MCG tablet Take 1,000 mcg by mouth daily.   Yes Historical Provider, MD   Allergies  Allergen Reactions  . Hydrochlorothiazide  Hyperurlcemia  . Tramadol     Leg weakness   Review of Systems  Unable to perform ROS: Dementia    Physical Exam  Constitutional:  Frail, cachetic  Cardiovascular: Normal rate and intact distal pulses.   Pulmonary/Chest: Effort normal.  Abdominal: Soft.  Neurological:  somnolent  Skin: Skin is warm and dry. There is pallor.  Psychiatric:  Advanced dementia    Vital Signs: BP (!) 90/52 (BP Location: Left Arm)   Pulse 90   Temp 98.5 F (36.9 C) (Oral)   Resp 18   Ht 5' 5"  (1.651 m)   Wt 39.8 kg (87 lb 12.8 oz)   SpO2 100%   BMI 14.61 kg/m  Pain Assessment: PAINAD   Pain Score: 0-No pain   SpO2: SpO2: 100 % O2 Device:SpO2: 100 % O2 Flow Rate: .O2 Flow Rate (L/min): 2 L/min  IO: Intake/output summary:  Intake/Output Summary (Last 24 hours) at 06/17/16 0901 Last data filed at 06/17/16 0705  Gross per 24 hour  Intake             1137 ml  Output                0 ml  Net             1137 ml    LBM: Last BM Date: 06/17/16 Baseline Weight: Weight: 38.6 kg (85 lb) Most recent weight: Weight: 39.8 kg (87 lb 12.8 oz)     Palliative Assessment/Data: PPS: 20%     Thank you for this consult. Palliative medicine will continue to follow and assist as needed.   Time In: 0830 Time Out: 0900 Time In: 1030 Time Out: 1130 Time Total: 90 minutes Greater than 50%  of this time was spent  counseling and coordinating care related to the above assessment and plan.  Signed by: Mariana Kaufman, AGNP-C Palliative Medicine    Please contact Palliative Medicine Team phone at [phone] for questions and concerns.  For individual provider: See Shea Evans

## 2016-06-17 NOTE — Progress Notes (Signed)
New referral for [facility] services at home following discharge received from [facility] following a Palliative Medicine consult. Mrs. Hornaday is an 83 year old woman with a known history of Alzheimer's dementia admitted to [facility] on 1/7 for evaluation of poor oral intake and decline. She was found to have hypernatremia and acute kidney injury, she has received IVF and antibiotics. Family met this morning with Palliative Medicine NP Mariana Kaufman and have chosen to focus on her comfort with the support of hospice services at home. Writer met in the family room with patient's daughter in law Sharyn Lull, daughter Barnetta Chapel and grandson Stormy Card to initiate education regarding hospice services, philosophy and team approach to care with good understanding voiced. Hospice informationa nd contact number given to [facility]. Plan is for discharge home tomorrow 1/9 via EMS with signed portable DNR form in place. No DME needs at this time. Patient has oxygen and a hospital bed in place in her home. Patient information faxed to referral intake. Writer to follow through final disposition. Thank you. Flo Shanks RN, BSN, [place] and [facility] [place], [facility] [phone] c

## 2016-06-17 NOTE — Plan of Care (Signed)
Problem: Education: Goal: Knowledge of [place] [facility] Education information/materials will improve Outcome: Not Progressing No evidence of learning.  Problem: Skin Integrity: Goal: Risk for impaired skin integrity will decrease Outcome: Progressing Patient to be turned every two hours.  Problem: Nutrition: Goal: Adequate nutrition will be maintained Outcome: Progressing Patient took pills crushed in applesauce. Breakfast to be delivered; patient will be assisted with feeding.

## 2016-06-17 NOTE — Care Management (Signed)
Admitted to this facility with the diagnosis of hypernatremia. Lives with daughter-in-law Marcelino DusterMichelle and son Brayton CavesJessie x 5-6 years [phone]). Scheduled appointment with Dr. Shayne AlkenLam this month. No skilled facility. Home Health per [facility] when discharged from this facility in November. No prescribed home oxygen. Family indicated that they borrowed oxygen tank from a friend. Rolling walker and  hoyer lift in the home. Family states she needed a chair slide. States she has been bed bound x 1 week. Limited intake x 1 week. No falls. States son picks her up and carries her down the hall. [facility] [place].  Family conference with Palliative Care at 10:30am. Gwenette GreetBrenda S Itzelle Gains RN MSN CCM Care Management

## 2016-06-17 NOTE — Care Management Important Message (Signed)
Important Message  Patient Details  Name: Kathleen Graham MRN: [id number] Date of Birth: 01/23/1932   Medicare Important Message Given:  Yes    Gwenette GreetBrenda S Kinberly Perris, RN 06/17/2016, 9:11 AM

## 2016-06-17 NOTE — Progress Notes (Signed)
Initial Nutrition Assessment  DOCUMENTATION CODES:   Severe malnutrition in context of acute illness/injury, Underweight; Malnutrition likely also in chronic component  INTERVENTION:  Continue comfort feedings as tolerated by patient.  Provide Ensure Enlive PRN per family request.  No further nutrition intervention warranted as patient with prognosis <2 weeks.  NUTRITION DIAGNOSIS:   Malnutrition (Severe) related to acute illness as evidenced by 30 percent weight loss over 2 months, energy intake < or equal to 50% for > or equal to 5 days.  GOAL:   Other (Comment) (Comfort Feedings)  MONITOR:   PO intake, Labs  REASON FOR ASSESSMENT:   Consult Assessment of nutrition requirement/status  ASSESSMENT:   83 year old female with stage IV Alzheimer's dementia who presents with decreased appetite found to have hypernatremia and acute kidney injury.   -Palliative Medicine has been consulted to discuss goals of care. Patient with poor prognosis <2 weeks per chart.  -Per chart family has [facility] and patient will discharge 1/9.   Spoke with patient's family at bedside. They report her intake has been significantly decreased for the past 10 days. She now has difficulty swallowing solid foods. She has been able to tolerate liquids, bites of ice cream. Prior to that she was having cereal with milk, sandwiches, salisbury steak, pudding, mashed potatoes, and Ensure. UBW was 140 lbs one year ago. However, 2 months go patient was 124 lbs. She has lost 37 lbs (30% body weight) over 2 months, which is significant for time frame.  Meal Completion: 20% per chart  Medications reviewed.   Labs reviewed: Sodium 147, Potassium 3.2, Chloride 120, BUN 31, Creatinine 1.42, Anion gap 4, elevated Troponin.   Unable to complete Nutrition-Focused physical exam at this time out of respect for patient's comfort.   Discussed with RN. Patient likely transitioning to comfort care  but family has not yet made final decision.   Diet Order:  DIET - DYS 1 Room service appropriate? No; Fluid consistency: Thin  Skin:  Wound (see comment) (PI to sacrum, Stg I to heel)  Last BM:  06/17/2016  Height:   Ht Readings from Last 1 Encounters:  06/16/16 5\' 5"  (1.651 m)    Weight:   Wt Readings from Last 1 Encounters:  06/16/16 87 lb 12.8 oz (39.8 kg)    Ideal Body Weight:  56.8 kg  BMI:  Body mass index is 14.61 kg/m.  Estimated Nutritional Needs:   Kcal:  1040-1210 (HBE x 1.2-1.4)  Protein:  60-70 grams (1.5-1.7 grams/kg)  Fluid:  1 L/day  EDUCATION NEEDS:   No education needs identified at this time  Helane RimaLeanne Cheryn Lundquist, MS, RD, LDN Pager: [phone] After Hours Pager: [phone]

## 2016-06-17 NOTE — Progress Notes (Signed)
While rounding, CH made initial visit to room 104. Pt was non responsive. Family was bedside. 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 Family requested prayer, which was provided. CH is available for follow up as needed.    06/17/16 1400  Clinical Encounter Type  Visited With Patient;Patient and family together  Visit Type Initial;Spiritual support  Referral From Nurse  Spiritual Encounters  Spiritual Needs Prayer;Emotional

## 2016-06-17 NOTE — Progress Notes (Addendum)
[place] at [place] NAME: Kathleen Graham    MR#:  [id number]  DATE OF BIRTH:  Aug 30, 1931  SUBJECTIVE:   Patient was brought in by family due to significant decline with poor by mouth intake and increased sleepiness at home. She was found to be severely dehydrated with elevated sodium. Patient is dementia unable to provide any history. Spoke with daughter-in-law and daughter. We'll present in the room REVIEW OF SYSTEMS:   Review of Systems  Unable to perform ROS: Dementia   Tolerating Diet:no Tolerating PT: bed bound  DRUG ALLERGIES:   Allergies  Allergen Reactions  . Hydrochlorothiazide     Hyperurlcemia  . Tramadol     Leg weakness    VITALS:  Blood pressure (!) 96/51, pulse 87, temperature 97.6 F (36.4 C), temperature source Oral, resp. rate 18, height 5' 5"  (1.651 m), weight 39.8 kg (87 lb 12.8 oz), SpO2 99 %.  PHYSICAL EXAMINATION:   Physical Exam  GENERAL:  83 y.o.-year-old patient lying in the bed with no acute distress. Thin cachectic EYES: Pupils equal, round, reactive to light and accommodation. No scleral icterus. Extraocular muscles intact.  HEENT: Head atraumatic, normocephalic. Oropharynx and nasopharynx clear. Dry oral mucosa NECK:  Supple, no jugular venous distention. No thyroid enlargement, no tenderness.  LUNGS: Normal breath sounds bilaterally, no wheezing, rales, rhonchi. No use of accessory muscles of respiration.  CARDIOVASCULAR: S1, S2 normal. No murmurs, rubs, or gallops.  ABDOMEN: Soft, nontender, nondistended. Bowel sounds present. No organomegaly or mass.  EXTREMITIES: No cyanosis, clubbing or edema b/l.    NEUROLOGIC: Unable to assess  PSYCHIATRIC:  patient is alert and does not communicate  SKIN: No obvious rash, lesion, or ulcer.   LABORATORY PANEL:  CBC  Recent Labs Lab 06/17/16 0327  WBC 9.7  HGB 8.7*  HCT 26.9*  PLT 240    Chemistries   Recent Labs Lab 06/16/16 1351   06/17/16 0327 06/17/16 0938  NA 158*  < > 147*  147* 147*  K 3.8  --  3.2*  --   CL 123*  --  120*  --   CO2 26  --  23  --   GLUCOSE 119*  --  107*  --   BUN 39*  --  31*  --   CREATININE 1.90*  --  1.42*  --   CALCIUM 9.3  --  7.9*  --   AST 37  --   --   --   ALT 22  --   --   --   ALKPHOS 154*  --   --   --   BILITOT 0.7  --   --   --   < > = values in this interval not displayed. Cardiac Enzymes  Recent Labs Lab 06/17/16 0327  TROPONINI 0.13*   RADIOLOGY:  Dg Chest 1 View  Result Date: 06/17/2016 CLINICAL DATA:  Shortness of breath.  Dementia up.  Pneumonia. EXAM: CHEST 1 VIEW COMPARISON:  06/16/2016 and multiple previous FINDINGS: Left ventricular prominence again demonstrated. Atherosclerotic change of the thoracic aorta. Chronic pulmonary fibrosis pattern which is progressive over time. No certain acute infiltrate. One could question the possibility of acute density superimposed on the left lower lobe chronic changes. No acute bone finding. IMPRESSION: Advanced chronic pulmonary fibrotic pattern. Question superimposed acute infiltrate in the left lower lobe. Electronically Signed   By: Nelson Chimes M.D.   On: 06/17/2016 07:20   Dg Chest 2  View  Result Date: 06/16/2016 CLINICAL DATA:  Cough and congestion for several weeks EXAM: CHEST  2 VIEW COMPARISON:  05/07/2015 FINDINGS: Cardiac shadow is stable. Aortic calcifications are noted. Fibrotic changes are noted throughout both lungs which are chronic in nature. No focal infiltrate or sizable effusion is seen. No acute bony abnormality is noted. IMPRESSION: Chronic fibrotic changes without acute abnormality. Electronically Signed   By: Inez Catalina M.D.   On: 06/16/2016 14:37   ASSESSMENT AND PLAN:  83 year old female with stage IV Alzheimer's dementia who presents with decreased appetite found to have hypernatremia and acute kidney injury.  1. Hypernatremia: This is due to poor by mouth intake and advanced dementia Receiving  D5W  2 Sepsis with leukocytosis, tachycardia and tachypnea: Although chest x-ray does not show pneumonia I am concerned patient may have pneumonia due to her cough and on my read of chest x-ray does appear that on the left side she may have some infiltrate. recieved Levaquin x 1  3 Acute kidney injury in the setting of poor by mouth intake: -Receiving IV fluids for comfort.  4. Advanced dementia: With severe failure to thrive  Palliative care may met with extended family members. Family requested patient be discharged to home with hospice services tomorrow. Management for hospice screening Patient's family understand she carries a poor prognosis  5. Failure to thrive, adult due to dementia and protein calorie malnutrition: Continue IV hydration.    Case discussed with Care Management/Social Worker. Management plans discussed with the patient, family and they are in agreement.  CODE STATUS: DO NOT RESUSCITATE  DVT Prophylaxis: Renal dosing of Lovenox   TOTAL TIME TAKING CARE OF THIS PATIENT: 40 minutes.  >50% time spent on counselling and coordination of care family  POSSIBLE D/C IN 1 DAYS, DEPENDING ON CLINICAL CONDITION.  Note: This dictation was prepared with Dragon dictation along with smaller phrase technology. Any transcriptional errors that result from this process are unintentional.  [name] M.D on 06/17/2016 at 1:52 PM  Between 7am to 6pm - Pager - [phone]  After 6pm go to [email or web address] - password [email or web address] [place] [facility]  Office  [phone]  CC: Primary care physician; Philmore Pali, NP

## 2016-06-17 NOTE — Progress Notes (Signed)
Pharmacy dose adjusted lovenox from 40 mg daily to 30 mg daily subcutaneously for CrCl < 30 ml/min.  Patient's CrCl = 19.2 ml/min  Thomasene Rippleavid Galya Dunnigan, PharmD, BCPS Clinical Pharmacist 06/17/2016

## 2016-06-17 NOTE — Care Management (Signed)
Discussed Hospice Agencies with Ms. Montrose's family per Dr. Allena KatzPatel. [facility]. Dayna BarkerKaren Robertson, RN representative for [facility] updated. Discharge 06/18/16 per Dr. Ouida SillsPatel Alfonse Garringer S Frederik Standley RN MSN CCM Care Management

## 2016-06-17 NOTE — Consult Note (Signed)
[facility] CONSULT NOTE    Date: 06/17/2016                  Patient Name:  Kathleen Graham  MRN: [id number]  DOB: 10-Jul-1931  Age / Sex: 83 y.o., female         PCP: Ronal Fear, NP                 Service Requesting Consult: Hospitalisit                 Reason for Consult: Acute renal failure, Hypernatremia            History of Present Illness: Patient is a 83 y.o. female with a PMHx of Severe Alzheimer's dementia, chronic kidney disease stage III, hypertension, history of TIA, hyperlipidemia, osteoporosis, who was admitted to [facility] on 06/16/2016 for evaluation of decreased appetite and by mouth intake. Patient unable to provide any history. The patient's caretakers were at the bedside and able to provide much of the history. The patient's daughter-in-law reports that she stays with her most of the time. Over the past several months she's had decreasing by mouth intake. It also appears that there are periods where the patient is likely aspirating.  Upon presentation she was found to have multiple metabolic derangements. Serum sodium was high at 147. BUN was also up to 31 with a creatinine of 1.42. She is also significantly anemic with a hemoglobin of 8.7. The patient's family recognizes that she is declining and appears to be open to hospice care.   Medications: Outpatient medications: Prescriptions Prior to Admission  Medication Sig Dispense Refill Last Dose  . acetaminophen (TYLENOL) 500 MG tablet Take 2 tablets (1,000 mg total) by mouth 3 (three) times daily as needed.  0 prn at prn  . albuterol (PROVENTIL HFA;VENTOLIN HFA) 108 (90 BASE) MCG/ACT inhaler Inhale 2 puffs into the lungs every 4 (four) hours as needed for wheezing or shortness of breath.   prn at prn  . albuterol (PROVENTIL) (2.5 MG/3ML) 0.083% nebulizer solution Take 2.5 mg by nebulization every 6 (six) hours as needed for wheezing.   prn at prn  . aspirin 81 MG tablet Take 162 mg by mouth daily.     unknown at unknown  . atorvastatin (LIPITOR) 20 MG tablet Take 20 mg by mouth daily.   unknown at unknown  . citalopram (CELEXA) 20 MG tablet Take 20 mg by mouth daily.   unknown at unknown  . donepezil (ARICEPT) 10 MG tablet Take 10 mg by mouth at bedtime.   06/15/2016 at hs  . HYDROcodone-acetaminophen (NORCO/VICODIN) 5-325 MG tablet Take 1 tablet by mouth daily as needed.   prn at prn  . memantine (NAMENDA) 10 MG tablet Take 10 mg by mouth 2 (two) times daily.   unknown at unknown  . tiotropium (SPIRIVA) 18 MCG inhalation capsule Place 18 mcg into inhaler and inhale daily.   unknown at unknown  . vitamin B-12 (CYANOCOBALAMIN) 1000 MCG tablet Take 1,000 mcg by mouth daily.   unknown at unknown    Current medications: Current Facility-Administered Medications  Medication Dose Route Frequency Provider Last Rate Last Dose  . acetaminophen (TYLENOL) tablet 650 mg  650 mg Oral Q6H PRN Adrian Saran, MD       Or  . acetaminophen (TYLENOL) suppository 650 mg  650 mg Rectal Q6H PRN Sital Mody, MD      . albuterol (PROVENTIL) (2.5 MG/3ML) 0.083% nebulizer solution 2.5 mg  2.5 mg Nebulization Q6H PRN Adrian SaranSital Mody, MD      . bisacodyl (DULCOLAX) EC tablet 5 mg  5 mg Oral Daily PRN Adrian SaranSital Mody, MD      . citalopram (CELEXA) tablet 20 mg  20 mg Oral Daily Adrian SaranSital Mody, MD   20 mg at 06/17/16 0815  . enoxaparin (LOVENOX) injection 30 mg  30 mg Subcutaneous Q24H Enedina FinnerSona Patel, MD      . feeding supplement (ENSURE ENLIVE) (ENSURE ENLIVE) liquid 237 mL  237 mL Oral PRN Enedina FinnerSona Patel, MD      . LORazepam (ATIVAN) tablet 0.5 mg  0.5 mg Sublingual Q6H PRN Barbara CowerKasie J Mahan, NP      . MEDLINE mouth rinse  15 mL Mouth Rinse BID Adrian SaranSital Mody, MD   15 mL at 06/17/16 0815  . morphine CONCENTRATE 10 MG/0.5ML oral solution 5 mg  5 mg Sublingual Q2H PRN Barbara CowerKasie J Mahan, NP      . ondansetron (ZOFRAN) tablet 4 mg  4 mg Oral Q6H PRN Adrian SaranSital Mody, MD       Or  . ondansetron (ZOFRAN) injection 4 mg  4 mg Intravenous Q6H PRN Adrian SaranSital Mody, MD       . senna-docusate (Senokot-S) tablet 1 tablet  1 tablet Oral QHS PRN Adrian SaranSital Mody, MD      . tiotropium (SPIRIVA) inhalation capsule 18 mcg  18 mcg Inhalation Daily Adrian SaranSital Mody, MD   18 mcg at 06/17/16 0815      Allergies: Allergies  Allergen Reactions  . Hydrochlorothiazide     Hyperurlcemia  . Tramadol     Leg weakness      Past Medical History: Past Medical History:  Diagnosis Date  . Abnormality of gait 11/19/2012  . Alzheimer's dementia    stage 4  . Backache, unspecified   . Chronic kidney disease, stage III (moderate) 12/01/2007  . Coronary atherosclerosis of unspecified type of vessel, native or graft   . Cystocele, midline   . Essential hypertension, benign   . Full incontinence of feces   . History of acute myocardial infarction   . History of TIA (transient ischemic attack) 2005  . Hyperuricemia    Due to HCTZ  . Insomnia, unspecified   . Lipoprotein deficiencies   . Memory deficits 11/19/2012  . Mixed hyperlipidemia   . Osteoporosis, unspecified 07/03/12   Hip and foremar by DEXA  . Other B-complex deficiencies   . Other persistent mental disorders due to conditions classified elsewhere 08-2007  . 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 Tobacco use disorder 09/01/2007  . Unspecified cataract   . Unspecified urinary incontinence      Past Surgical History: Past Surgical History:  Procedure Laterality Date  . ABDOMINAL HYSTERECTOMY    . CATARACT EXTRACTION Bilateral      Family History: Family History  Problem Relation Age of Onset  . Heart attack Son      Social History: Social History   Social History  . Marital status: Widowed    Spouse name: N/A  . Number of children: 7  . Years of education: 10   Occupational History  . Not on file.   Social History Main Topics  . Smoking status: Former Smoker    Quit date: 06/11/2011  . Smokeless tobacco: Never Used  . Alcohol use No  . Drug use: No  . Sexual activity: Not on  file   Other Topics Concern  . Not on file   Social  History Narrative  . No narrative on file     Review of Systems: Patient unable to provide secondary to dementia.  Vital Signs: Blood pressure (!) 96/51, pulse 87, temperature 97.6 F (36.4 C), temperature source Oral, resp. rate 18, height 5\' 5"  (1.651 m), weight 39.8 kg (87 lb 12.8 oz), SpO2 99 %.  Weight trends: Filed Weights   06/16/16 1341 06/16/16 1741  Weight: 38.6 kg (85 lb) 39.8 kg (87 lb 12.8 oz)    Physical Exam: General: Cachetic   Head: Normocephalic, atraumatic.  Eyes: Anicteric, EOMI  Nose: Mucous membranes dry, not inflammed, nonerythematous.  Throat: Oropharynx nonerythematous, no exudate appreciated. OM very dry  Neck: Supple, trachea midline.  Lungs:  Normal respiratory effort. Coarse bilateral rhonchi.   Heart: RRR. S1 and S2 normal without gallop, murmur, or rubs.  Abdomen:  BS normoactive. Soft, Nondistended, non-tender.  No masses or organomegaly.  Extremities: No pretibial edema.  Neurologic: Awake and alert but not consistently following commands   Skin: Scattered ecchymoses     Lab results: Basic Metabolic Panel:  Recent Labs Lab 06/16/16 1351  06/16/16 2348 06/17/16 0327 06/17/16 0938  NA 158*  < > 148* 147*  147* 147*  K 3.8  --   --  3.2*  --   CL 123*  --   --  120*  --   CO2 26  --   --  23  --   GLUCOSE 119*  --   --  107*  --   BUN 39*  --   --  31*  --   CREATININE 1.90*  --   --  1.42*  --   CALCIUM 9.3  --   --  7.9*  --   < > = values in this interval not displayed.  Liver Function Tests:  Recent Labs Lab 06/16/16 1351  AST 37  ALT 22  ALKPHOS 154*  BILITOT 0.7  PROT 8.9*  ALBUMIN 2.1*   No results for input(s): LIPASE, AMYLASE in the last 168 hours. No results for input(s): AMMONIA in the last 168 hours.  CBC:  Recent Labs Lab 06/16/16 1351 06/17/16 0327  WBC 16.2* 9.7  NEUTROABS 12.9*  --   HGB 11.5* 8.7*  HCT 36.9 26.9*  MCV 88.3 87.3  PLT 403  240    Cardiac Enzymes:  Recent Labs Lab 06/16/16 1351 06/16/16 1741 06/16/16 2348 06/17/16 0327  TROPONINI 0.05* 0.11* 0.05* 0.13*    BNP: Invalid input(s): POCBNP  CBG: No results for input(s): GLUCAP in the last 168 hours.  Microbiology: Results for orders placed or performed during the hospital encounter of 06/16/16  Blood culture (routine x 2)     Status: None (Preliminary result)   Collection Time: 06/16/16  2:36 PM  Result Value Ref Range Status   Specimen Description BLOOD RIGHT ASSIST CONTROL  Final   Special Requests BOTTLES DRAWN AEROBIC AND ANAEROBIC ANA8ML AER10ML  Final   Culture NO GROWTH < 24 HOURS  Final   Report Status PENDING  Incomplete  Blood culture (routine x 2)     Status: None (Preliminary result)   Collection Time: 06/16/16  3:06 PM  Result Value Ref Range Status   Specimen Description BLOOD LEFT ASSIST CONTROL  Final   Special Requests BOTTLES DRAWN AEROBIC AND ANAEROBIC ANA3ML AER5ML  Final   Culture NO GROWTH < 24 HOURS  Final   Report Status PENDING  Incomplete  Rapid Influenza A&B Antigens ([facility] only)     Status:  None   Collection Time: 06/16/16  3:06 PM  Result Value Ref Range Status   Influenza A ([facility]) NEGATIVE NEGATIVE Final   Influenza B ([facility]) NEGATIVE NEGATIVE Final    Coagulation Studies: No results for input(s): LABPROT, INR in the last 72 hours.  Urinalysis:  Recent Labs  06/16/16 1351  COLORURINE YELLOW*  LABSPEC 1.019  PHURINE 5.0  GLUCOSEU NEGATIVE  HGBUR SMALL*  BILIRUBINUR NEGATIVE  KETONESUR NEGATIVE  PROTEINUR 30*  NITRITE NEGATIVE  LEUKOCYTESUR TRACE*      Imaging: Dg Chest 1 View  Result Date: 06/17/2016 CLINICAL DATA:  Shortness of breath.  Dementia up.  Pneumonia. EXAM: CHEST 1 VIEW COMPARISON:  06/16/2016 and multiple previous FINDINGS: Left ventricular prominence again demonstrated. Atherosclerotic change of the thoracic aorta. Chronic pulmonary fibrosis pattern which is progressive over time. No  certain acute infiltrate. One could question the possibility of acute density superimposed on the left lower lobe chronic changes. No acute bone finding. IMPRESSION: Advanced chronic pulmonary fibrotic pattern. Question superimposed acute infiltrate in the left lower lobe. Electronically Signed   By: Paulina Fusi M.D.   On: 06/17/2016 07:20   Dg Chest 2 View  Result Date: 06/16/2016 CLINICAL DATA:  Cough and congestion for several weeks EXAM: CHEST  2 VIEW COMPARISON:  05/07/2015 FINDINGS: Cardiac shadow is stable. Aortic calcifications are noted. Fibrotic changes are noted throughout both lungs which are chronic in nature. No focal infiltrate or sizable effusion is seen. No acute bony abnormality is noted. IMPRESSION: Chronic fibrotic changes without acute abnormality. Electronically Signed   By: Alcide Clever M.D.   On: 06/16/2016 14:37      Assessment & Plan: Pt is a 83 y.o. female with a PMHx of Severe Alzheimer's dementia, chronic kidney disease stage III, hypertension, history of TIA, hyperlipidemia, osteoporosis, who was admitted to [facility] on 06/16/2016 for evaluation of decreased appetite and by mouth intake.   1.  Acute renal failure due to poor po intake. 2.  CKD stage III baseline Cr 1.17 3.  Hypernatremia.  4.  Hypokalemia. 5.  Severe underlying dementia.  Plan:  The patient unfortunately has had progressive decline while at home. She has now developed multiple metabolic derangements including hyponatremia, acute renal failure, and hypokalemia. It also appears that the patient is likely aspirating saliva and food contents. We agree with conservative measures now.  She was given D5W earlier. We had a long discussion with the family and they were open to home with hospice. Therefore we discussed this with care management and made the appropriate referrals earlier today. Therefore no further input from our perspective. Thanks for consultation.

## 2016-06-18 MED ORDER — MORPHINE SULFATE (CONCENTRATE) 10 MG/0.5ML PO SOLN: 5.0000 mg | ORAL | 0 refills | Status: AC | PRN

## 2016-06-18 MED ORDER — LORAZEPAM 0.5 MG PO TABS: 0.5000 mg | ORAL_TABLET | Freq: Four times a day (QID) | ORAL | 0 refills | Status: AC | PRN

## 2016-06-18 NOTE — Progress Notes (Signed)
Follow up visit made to new referral for hospice services at home. Family at bedside, patient seen sleeping, did awaken to voice. Discharge summary faxed top referral, [facility]. Signed DNR in place in discharge packet. Thank you. Dayna BarkerKaren Robertson RN, BSN, [facility], [facility] [phone] c

## 2016-06-18 NOTE — Progress Notes (Signed)
EMS called for transport to patients home address. Milady Fleener S, RN  

## 2016-06-18 NOTE — Discharge Summary (Signed)
[place] [facility] [place] NAME: Kathleen Graham    MR#:  [id number]  DATE OF BIRTH:  May 05, 1932  DATE OF ADMISSION:  06/16/2016 ADMITTING PHYSICIAN: Bettey Costa, MD  DATE OF DISCHARGE: 06/18/16  PRIMARY CARE PHYSICIAN: Philmore Pali, NP    ADMISSION DIAGNOSIS:  Dehydration [E86.0] Hypernatremia [E87.0] Pneumonia [J18.9] Failure to thrive (0-17) [R62.51]  DISCHARGE DIAGNOSIS:  Failure to Thrive Severe hypernatremia Acute renal Failure Alzheimer's dementia SECONDARY DIAGNOSIS:   Past Medical History:  Diagnosis Date  . Abnormality of gait 11/19/2012  . Alzheimer's dementia    stage 4  . Backache, unspecified   . Chronic kidney disease, stage III (moderate) 12/01/2007  . Coronary atherosclerosis of unspecified type of vessel, native or graft   . Cystocele, midline   . Essential hypertension, benign   . Full incontinence of feces   . History of acute myocardial infarction   . History of TIA (transient ischemic attack) 2005  . Hyperuricemia    Due to HCTZ  . Insomnia, unspecified   . Lipoprotein deficiencies   . Memory deficits 11/19/2012  . Mixed hyperlipidemia   . Osteoporosis, unspecified 07/03/12   Hip and foremar by DEXA  . Other B-complex deficiencies   . Other persistent mental disorders due to conditions classified elsewhere 08-2007  . 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 Tobacco use disorder 09/01/2007  . Unspecified cataract   . Unspecified urinary incontinence     HOSPITAL COURSE:   83 year old female with stage IV Alzheimer's dementia who presents with decreased appetite found to have hypernatremia and acute kidney injury.  1. Hypernatremia: This is due to poor by mouth intake and advanced dementia Received D5W  2Sepsis with leukocytosis, tachycardia and tachypnea: Although chest x-ray does not show pneumonia I am concerned patient may have pneumonia due to her cough and on my read of chest  x-ray does appear that on the left side she may have some infiltrate. recieved Levaquin x 1 Pt now comfort care  3Acute kidney injury in the setting of poor by mouth intake: -ReceivedIV fluids for comfort.  4. Advanced dementia: With severe failure to thrive  Palliative care may met with extended family members.  Family requested patient be discharged to home with hospice services Patient's family understand she carries a poor prognosis  5. Failure to thrive, adult due to dementia and protein calorie malnutrition:  D/c home with hospice  CONSULTS OBTAINED:  Treatment Team:  Anthonette Legato, MD  DRUG ALLERGIES:   Allergies  Allergen Reactions  . Hydrochlorothiazide     Hyperurlcemia  . Tramadol     Leg weakness    DISCHARGE MEDICATIONS:   Current Discharge Medication List    START taking these medications   Details  LORazepam (ATIVAN) 0.5 MG tablet Place 1 tablet (0.5 mg total) under the tongue every 6 (six) hours as needed for anxiety. Qty: 30 tablet, Refills: 0    Morphine Sulfate (MORPHINE CONCENTRATE) 10 MG/0.5ML SOLN concentrated solution Place 0.25 mLs (5 mg total) under the tongue every 2 (two) hours as needed for moderate pain, severe pain, anxiety or shortness of breath. Qty: 180 mL, Refills: 0      CONTINUE these medications which have NOT CHANGED   Details  acetaminophen (TYLENOL) 500 MG tablet Take 2 tablets (1,000 mg total) by mouth 3 (three) times daily as needed. Refills: 0    albuterol (PROVENTIL HFA;VENTOLIN HFA) 108 (90 BASE) MCG/ACT inhaler Inhale  2 puffs into the lungs every 4 (four) hours as needed for wheezing or shortness of breath.    albuterol (PROVENTIL) (2.5 MG/3ML) 0.083% nebulizer solution Take 2.5 mg by nebulization every 6 (six) hours as needed for wheezing.    vitamin B-12 (CYANOCOBALAMIN) 1000 MCG tablet Take 1,000 mcg by mouth daily.      STOP taking these medications     aspirin 81 MG tablet      atorvastatin (LIPITOR)  20 MG tablet      citalopram (CELEXA) 20 MG tablet      donepezil (ARICEPT) 10 MG tablet      HYDROcodone-acetaminophen (NORCO/VICODIN) 5-325 MG tablet      memantine (NAMENDA) 10 MG tablet      tiotropium (SPIRIVA) 18 MCG inhalation capsule         If you experience worsening of your admission symptoms, develop shortness of breath, life threatening emergency, suicidal or homicidal thoughts you must seek medical attention immediately by calling 911 or calling your MD immediately  if symptoms less severe.  You Must read complete instructions/literature along with all the possible adverse reactions/side effects for all the Medicines you take and that have been prescribed to you. Take any new Medicines after you have completely understood and accept all the possible adverse reactions/side effects.   Please note  You were cared for by a hospitalist during your hospital stay. If you have any questions about your discharge medications or the care you received while you were in the hospital after you are discharged, you can call the unit and asked to speak with the hospitalist on call if the hospitalist that took care of you is not available. Once you are discharged, your primary care physician will handle any further medical issues. Please note that NO REFILLS for any discharge medications will be authorized once you are discharged, as it is imperative that you return to your primary care physician (or establish a relationship with a primary care physician if you do not have one) for your aftercare needs so that they can reassess your need for medications and monitor your lab values. Today   SUBJECTIVE   lethargic  VITAL SIGNS:  Blood pressure (!) 98/41, pulse 76, temperature 98.4 F (36.9 C), resp. rate 20, height 5' 5"  (1.651 m), weight 39.8 kg (87 lb 12.8 oz), SpO2 97 %.  I/O:   Intake/Output Summary (Last 24 hours) at 06/18/16 0716 Last data filed at 06/17/16 1848  Gross per 24 hour   Intake              480 ml  Output                0 ml  Net              480 ml    PHYSICAL EXAMINATION:  GENERAL:  83 y.o.-year-old patient lying in the bed with no acute distress. Cachectic, thiin  EYES: Pupils equal, round, reactive to light and accommodation. No scleral icterus. Extraocular muscles intact.  HEENT: Head atraumatic, normocephalic. Oropharynx and nasopharynx clear.  NECK:  Supple, no jugular venous distention. No thyroid enlargement, no tenderness.  LUNGS: Normal breath sounds bilaterally, no wheezing, rales,rhonchi or crepitation. No use of accessory muscles of respiration.  CARDIOVASCULAR: S1, S2 normal. No murmurs, rubs, or gallops.  ABDOMEN: Soft, non-tender, non-distended. Bowel sounds present. No organomegaly or mass.  EXTREMITIES: No pedal edema, cyanosis, or clubbing.  NEUROLOGIC: unable to assess PSYCHIATRIC: resting SKIN: No obvious rash, lesion,  or ulcer.   DATA REVIEW:   CBC   Recent Labs Lab 06/17/16 0327  WBC 9.7  HGB 8.7*  HCT 26.9*  PLT 240    Chemistries   Recent Labs Lab 06/16/16 1351  06/17/16 0327 06/17/16 0938  NA 158*  < > 147*  147* 147*  K 3.8  --  3.2*  --   CL 123*  --  120*  --   CO2 26  --  23  --   GLUCOSE 119*  --  107*  --   BUN 39*  --  31*  --   CREATININE 1.90*  --  1.42*  --   CALCIUM 9.3  --  7.9*  --   AST 37  --   --   --   ALT 22  --   --   --   ALKPHOS 154*  --   --   --   BILITOT 0.7  --   --   --   < > = values in this interval not displayed.  Microbiology Results   Recent Results (from the past 240 hour(s))  Blood culture (routine x 2)     Status: None (Preliminary result)   Collection Time: 06/16/16  2:36 PM  Result Value Ref Range Status   Specimen Description BLOOD RIGHT ASSIST CONTROL  Final   Special Requests BOTTLES DRAWN AEROBIC AND ANAEROBIC ANA8ML AER10ML  Final   Culture NO GROWTH < 24 HOURS  Final   Report Status PENDING  Incomplete  Blood culture (routine x 2)     Status: None  (Preliminary result)   Collection Time: 06/16/16  3:06 PM  Result Value Ref Range Status   Specimen Description BLOOD LEFT ASSIST CONTROL  Final   Special Requests BOTTLES DRAWN AEROBIC AND ANAEROBIC ANA3ML AER5ML  Final   Culture NO GROWTH < 24 HOURS  Final   Report Status PENDING  Incomplete  Rapid Influenza A&B Antigens ([place] only)     Status: None   Collection Time: 06/16/16  3:06 PM  Result Value Ref Range Status   Influenza A ([place]) NEGATIVE NEGATIVE Final   Influenza B ([facility]) NEGATIVE NEGATIVE Final    RADIOLOGY:  Dg Chest 1 View  Result Date: 06/17/2016 CLINICAL DATA:  Shortness of breath.  Dementia up.  Pneumonia. EXAM: CHEST 1 VIEW COMPARISON:  06/16/2016 and multiple previous FINDINGS: Left ventricular prominence again demonstrated. Atherosclerotic change of the thoracic aorta. Chronic pulmonary fibrosis pattern which is progressive over time. No certain acute infiltrate. One could question the possibility of acute density superimposed on the left lower lobe chronic changes. No acute bone finding. IMPRESSION: Advanced chronic pulmonary fibrotic pattern. Question superimposed acute infiltrate in the left lower lobe. Electronically Signed   By: Nelson Chimes M.D.   On: 06/17/2016 07:20   Dg Chest 2 View  Result Date: 06/16/2016 CLINICAL DATA:  Cough and congestion for several weeks EXAM: CHEST  2 VIEW COMPARISON:  05/07/2015 FINDINGS: Cardiac shadow is stable. Aortic calcifications are noted. Fibrotic changes are noted throughout both lungs which are chronic in nature. No focal infiltrate or sizable effusion is seen. No acute bony abnormality is noted. IMPRESSION: Chronic fibrotic changes without acute abnormality. Electronically Signed   By: Inez Catalina M.D.   On: 06/16/2016 14:37     Management plans discussed with the patient, family and they are in agreement.  CODE STATUS:     Code Status Orders        Start  Ordered   06/16/16 1818  Do not attempt resuscitation (DNR)   Continuous    Question Answer Comment  In the event of cardiac or respiratory ARREST Do not call a "code blue"   In the event of cardiac or respiratory ARREST Do not perform Intubation, CPR, defibrillation or ACLS   In the event of cardiac or respiratory ARREST Use medication by any route, position, wound care, and other measures to relive pain and suffering. May use oxygen, suction and manual treatment of airway obstruction as needed for comfort.      06/16/16 1818    Code Status History    Date Active Date Inactive Code Status Order ID Comments User Context   06/16/2016  6:01 PM 06/16/2016  6:18 PM DNR [id number]  Bettey Costa, MD Inpatient   05/04/2015  3:02 AM 05/07/2015  7:51 PM Full Code [id number]  Saundra Shelling, MD Inpatient   07/08/2013  2:17 PM 07/10/2013  7:18 PM Partial Code [id number]  Dixon Boos, MD Inpatient   07/08/2013 10:03 AM 07/08/2013  2:17 PM Full Code [id number]  Ivor Costa, MD Inpatient   07/07/2013 11:52 PM 07/08/2013 10:03 AM DNR [id number]  Blain Pais, MD Inpatient      TOTAL TIME TAKING CARE OF THIS PATIENT: 40 minutes.    Eloy Fehl M.D on 06/18/2016 at 7:16 AM  Between 7am to 6pm - Pager - [phone] After 6pm go to [email or web address] - password [email or web address] [facility]  Office  [phone]  CC: Primary care physician; Philmore Pali, NP

## 2016-06-18 NOTE — Progress Notes (Signed)
CH made a follow up visit. Pt was being attended to by RN. Family was bedside. Family expressed appreciation for my previous visit. CH is available for follow up as needed.    06/18/16 1000  Clinical Encounter Type  Visited With Patient;Patient and family together  Visit Type Follow-up  Referral From Nurse

## 2016-06-18 NOTE — Progress Notes (Signed)
Patient discharged to home with hospice via EMS. Bo McclintockBrewer,Keldan Eplin S, RN

## 2016-06-18 NOTE — Care Management (Signed)
Discharge to home today per Dr. Allena KatzPatel.  Transportation arranged per [facility] per family's request. [facility]. Gwenette GreetBrenda S Giannah Zavadil RN MSN CCM Care Management

## 2016-06-19 LAB — URINE CULTURE: Culture: 80000 — AB

## 2016-06-21 LAB — CULTURE, BLOOD (ROUTINE X 2)
CULTURE: NO GROWTH
CULTURE: NO GROWTH

## 2016-08-08 DEATH — deceased

## 2017-02-28 IMAGING — CR DG CHEST 1V PORT
1 series · 1 of 1 positions shown · non-contrast
Comparison: 04/17/2014 and 06/21/2011.

CLINICAL DATA: Respiratory distress. Patient denies chest pain or
shortness of breath. Initial encounter.

EXAM:
PORTABLE CHEST - 1 VIEW

[ap]
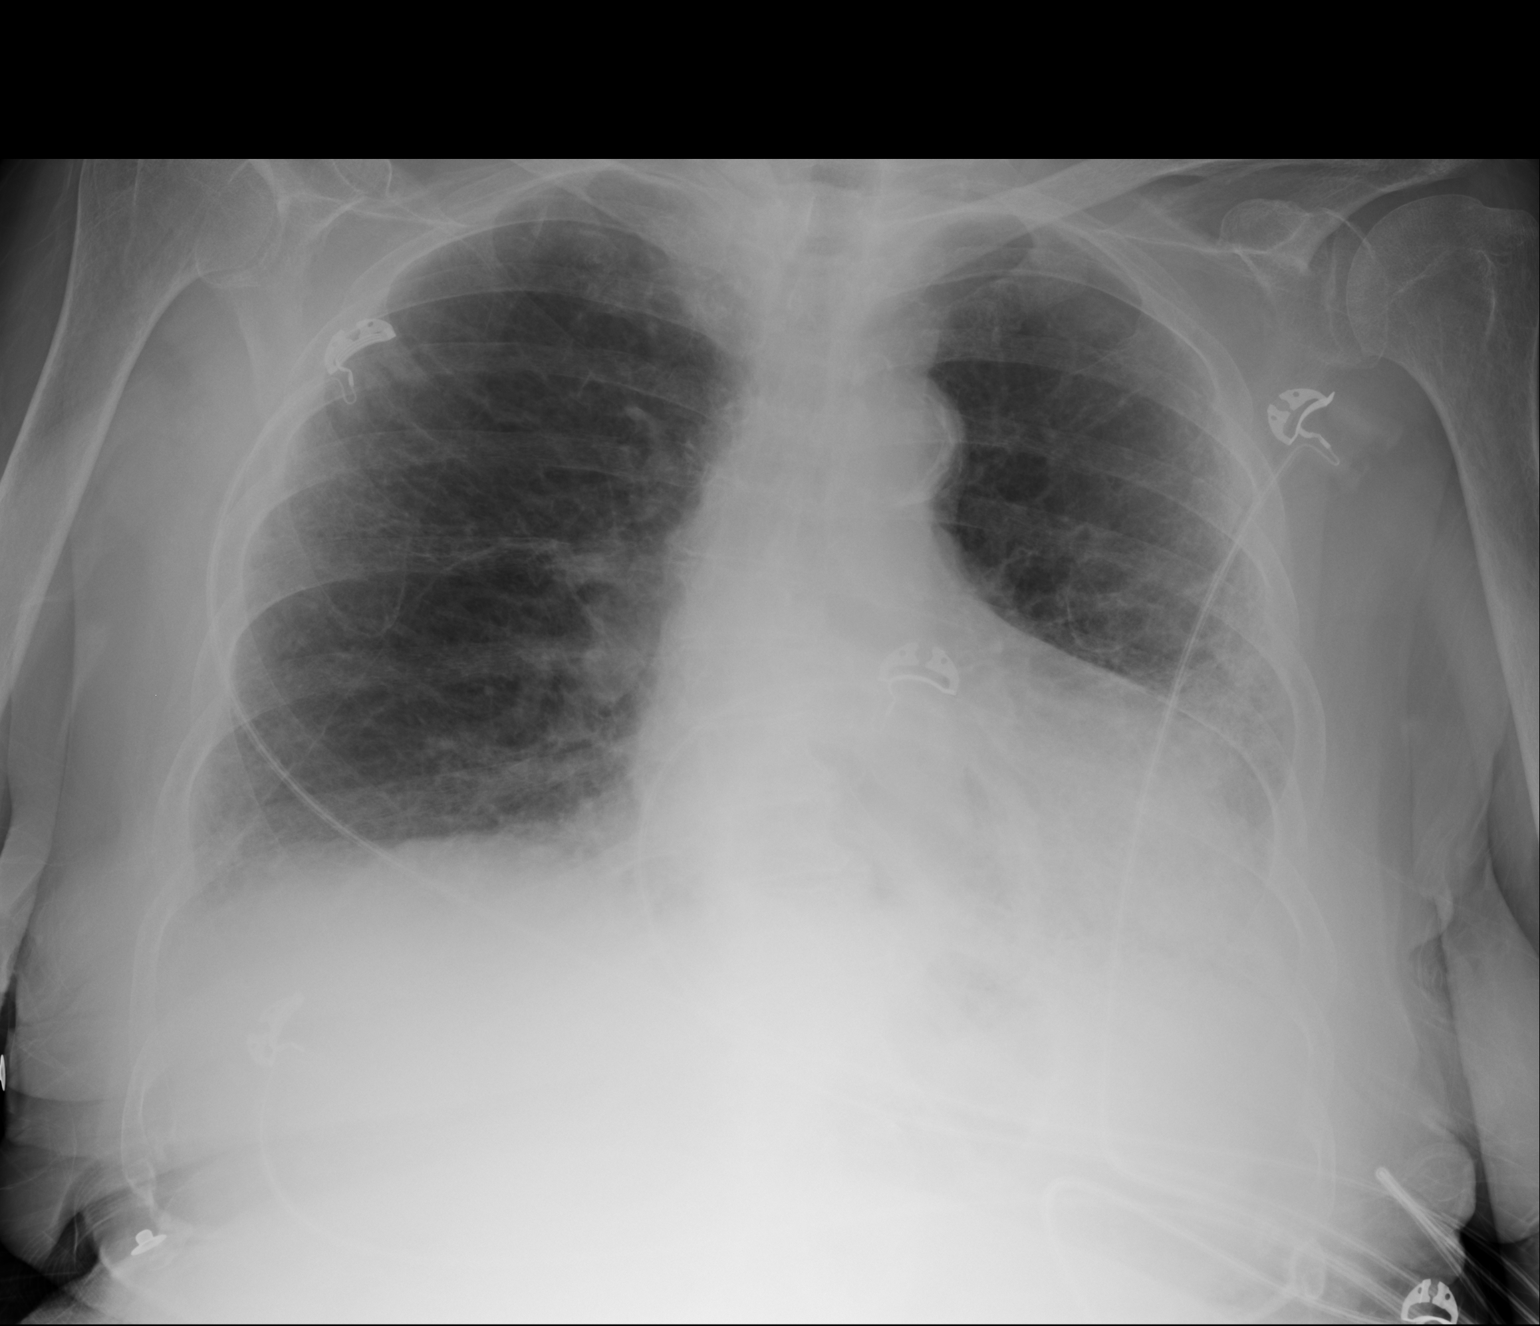

[1 of 1 positions shown; findings below may reference images not displayed]

FINDINGS: 3032 hours. The heart size and mediastinal contours are stable with
diffuse aortic atherosclerosis. Underlying pulmonary fibrotic
changes are again noted. There are lower lung volumes with mildly
increased bibasilar opacity, likely atelectasis. Retrocardiac
lucency suggests a hiatal hernia. No acute osseous findings
demonstrated.
IMPRESSION: Pulmonary fibrosis with suspected bibasilar atelectasis secondary to
suboptimal inspiration. No consolidation or definite edema.
